# Patient Record
Sex: Male | Born: 1955 | Race: White | Hispanic: No | Marital: Married | Smoking: Never smoker
Health system: Southern US, Community
[De-identification: ages and names within clinical notes are randomized; demographics above are authoritative.]

## PROBLEM LIST (undated history)

## (undated) DIAGNOSIS — L409 Psoriasis, unspecified: Secondary | ICD-10-CM

## (undated) HISTORY — PX: KNEE ARTHROSCOPY: SUR90

## (undated) HISTORY — DX: Psoriasis, unspecified: L40.9

---

## 2006-04-25 ENCOUNTER — Inpatient Hospital Stay (HOSPITAL_COMMUNITY): Admission: EM | Admit: 2006-04-25 | Discharge: 2006-04-27 | Payer: Self-pay | Admitting: Emergency Medicine

## 2008-01-01 ENCOUNTER — Encounter: Admission: RE | Admit: 2008-01-01 | Discharge: 2008-01-01 | Payer: Self-pay | Admitting: Orthopedic Surgery

## 2011-03-20 NOTE — Discharge Summary (Signed)
NAME:  Wayne Munoz, Wayne Munoz NO.:  [id number]   MEDICAL RECORD NO.:  [id number]          PATIENT TYPE:  INP   LOCATION:  5707                         FACILITY:  [facility]   PHYSICIAN:  Sherin Quarry, MD      DATE OF BIRTH:  1955-11-05   DATE OF ADMISSION:  04/25/2006  DATE OF DISCHARGE:  04/27/2006                                 DISCHARGE SUMMARY   Wayne Munoz is a 55 year old man who initially presented to [facility] on June 24 with a several day history of mid epigastric pain. Prior  to his presentation, he had a CT scan of the abdomen done as an outpatient  which was apparently negative. On the night prior to presentation, he ate  popcorn and then the pain became worse.  He presented to [facility]  where he was seen by Dr. Arthor Captain.  On physical exam, Dr. Arthor Captain noted that  the chest was clear.  Cardiovascular revealed normal S1and S2 without rubs,  murmurs or gallops.  The abdomen was remarkable for epigastric discomfort.   Laboratory studies obtained included CBC which showed a white count of 7800  and hemoglobin 14.7.  Sodium is 136, potassium 3.9, glucose was 90,  creatinine 1.2, BUN 9.  Liver profile was normal.  The amylase was 463,  lipase was 395.   The patient denied any history of alcohol abuse and, therefore, attention  was directed to his gallbladder.  An ultrasound of the gallbladder was done  on June 25 which showed no signs of gallstones or cholecystitis and a  hepatobiliary scan was done on the same date which was completely normal.  Lipid profile showed that the triglyceride level was 64.  Thus, the  explanation for the patient's episode of pancreatitis remained unclear. On  admission, Dr. Arthor Captain placed him on an IV of normal saline at 150 mL/hour  and made him n.p.o. Protonix 40 mg IV every 12 hours was begun as well as  morphine 2-4 mg every 2-4 hours p.r.n. for pain. The next day, the patient  was quite a bit better. We had  another conversation in which he once again  indicated that he had not consumed any alcohol. He was allowed to have a  clear liquid diet and then he was advanced to a bland low fat diet on June  26.  The patient was given instructions about a low fat diet.  By June 26,  the amylase was down to 167, lipase was 111. The patient was feeling good.  It was felt reasonable to go ahead and discharge him. Therefore, on June 26,  the patient was discharged.   DISCHARGE DIAGNOSIS:  Acute pancreatitis of uncertain etiology.   He was instructed at the time of discharge to follow up with [facility].   DISCHARGE MEDICATIONS:  Nexium 40 mg daily and Tylox, #20, 1-2 every 6 hours  p.r.n. for pain.   He was advised to follow a low fat diet for at least seven days and was told  that he could return to work on July 5.   CONDITION  ON DISCHARGE:  Good.   Please note that if the patient should have recurrent episodes of  pancreatitis, I would think that a GI consultation would be indicated for  possible ERCP, presuming that once again the etiology of pancreatitis is not  thought to be related to alcohol use.           ______________________________  Sherin Quarry, MD     SY/MEDQ  D:  05/11/2006  T:  05/11/2006  Job:  [id number]   cc:   [facility]

## 2011-03-20 NOTE — Discharge Summary (Signed)
NAME:  Wayne Munoz NO.:  [id number]   MEDICAL RECORD NO.:  [id number]          PATIENT TYPE:  INP   LOCATION:  5707                         FACILITY:  [facility]   PHYSICIAN:  Sherin Quarry, MD      DATE OF BIRTH:  Mar 15, 1956   DATE OF ADMISSION:  04/25/2006  DATE OF DISCHARGE:                                 DISCHARGE SUMMARY   HISTORY OF PRESENT ILLNESS:  Wayne Munoz is a 55 year old man who has  always been in good health.  His main complaint in the past has been  psoriasis.  On April 25, 2006, Wayne Munoz presented to [facility] emergency room with a several-day history of epigastric pain.  The  patient saw Dr. Morrie Sheldon and was diagnosed with pancreatitis.  A CT scan of the  abdomen was done which was negative.  He was started on Nexium and Carafate  and seemed to do better until the night prior to admission when he ate a  large amount of popcorn.  After doing this, he experienced more severe pain  described as midepigastric radiating through to the back.  He presented to  the emergency room with these complaints.   PHYSICAL EXAMINATION:  VITAL SIGNS:  In the emergency room, his blood  pressure was 101/61, heart rate 71, respirations 16.  HEENT:  Within normal limits.  CHEST:  Clear.  CARDIOVASCULAR:  Normal S1 and S2 without rubs, murmurs or gallops.  ABDOMEN:  The abdomen was soft.  It was not distended.  There was diffuse  abdominal tenderness, particularly in the epigastric area.  There was no  guarding or rebound.  NEUROLOGIC:  Neurologic testing was within normal limits.  EXTREMITIES:  No evidence of cyanosis or edema.   LABORATORY DATA:  White count of 7800, hemoglobin 14.7, potassium of 3.9,  creatinine 1.2.  Liver functions were normal.  Lipase was 395.  Amylase was  463.   Wayne Munoz was asked by several people about whether he had any history  of alcohol consumption.  He indicated that he did not drink any alcohol at  all.   On admission, he was started on an IV of normal saline at 150 cc per hour.  Protonix 40 mg IV every 12 hours was ordered, and the patient was given  morphine 2 mg every 2-4 hours p.r.n. for pain.  Because the patient had just  had a CT scan of the abdomen, this was not repeated.  Since there is no  history of alcohol consumption, we next turned our attention to possible  underlying gallbladder disease.  An abdominal ultrasound was obtained which  showed no evidence of cholecystitis or gallstones.  This was followed up  with a hepatobiliary scan that was absolutely normal, showing normal  gallbladder ejection.  A lipid profile was done to rule out  hypertriglyceridemia.  This also looked excellent.  The total cholesterol  was 123, LDL was 77, HDL was 33, triglycerides were 64.  With conservative  treatment, the patient's lipase and amylase came down to an amylase of 167,  lipase of 111, with are a very  normal range.  The patient remained afebrile.  His epigastric pain resolved.  His diet was slowly advanced, such that by  April 27, 2006 he was receiving a bland low-fat diet.  As he was having very  minimal pain, it was felt reasonable to discharge him at that time.   DISCHARGE DIAGNOSIS:  Pancreatitis of uncertain etiology with no evidence of  alcohol abuse or gallbladder disease   DISCHARGE MEDICATIONS:  1.  Nexium 40 mg daily.  2.  Tylox 1-2 every 6 hours p.r.n. for pain.   DIET:  The patient was instructed to follow a low-fat diet.   FOLLOWUP:  He was to return to Dr. Nelly Laurence office in about 7 days.  If [name] turns out to have recurrent episodes of pancreatitis, I would  suggest that he should be referred to a gastroenterologist and consideration  might be given to doing an ERCP and upper endoscopy.  The ERCP, of course,  should not be done during an acute episode of pancreatitis.  Wayne Munoz  also understands that he can return to the emergency room if he develops   worsening abdominal pain.           ______________________________  Sherin Quarry, MD     SY/MEDQ  D:  04/27/2006  T:  04/27/2006  Job:  [id number]   cc:   Alfredia Client, M.D.  [facility]  (also copy of H&P)

## 2011-03-20 NOTE — H&P (Signed)
NAME:  REEDY, BIERNAT NO.:  [id number]   MEDICAL RECORD NO.:  [id number]          PATIENT TYPE:  EMS   LOCATION:  MAJO                         FACILITY:  [facility]   PHYSICIAN:  Michelene Gardener, MD    DATE OF BIRTH:  Feb 21, 1956   DATE OF ADMISSION:  04/25/2006  DATE OF DISCHARGE:                                HISTORY & PHYSICAL   PRIMARY CARE PHYSICIAN:  Alfredia Client, M.D. at [facility].   IDENTIFYING INFORMATION/JUSTIFICATION FOR ADMISSION AND CARE:  This patient  will be admitted as an unassigned patient.   CHIEF COMPLAINT:  Increasing abdominal pain.   HISTORY OF PRESENT ILLNESS:  This is a 55 year old Caucasian male with no  significant past medical history who presented with the above mentioned  complaints. He stated that he has been having this abdominal pain for the  last few days. He went and saw his family physician a few days ago and was  diagnosed with pancreatitis. A CT scan of the abdomen was done as an  outpatient by his primary care physician and as per him, it was negative and  that was done 2 days ago. Last night, he ate popcorn and after that, his  abdominal pain became worse and his pain increased to about 6 to 7 out of  10. The abdominal pain was described as a sharp pain, mainly in the  epigastric region and sometimes radiating to his other part of the abdomen  and to his back, increased when he goes to the bathroom and was eating and  no other relieving factors. Currently, his pain is around a 2 out of 10  because he received pain medication. In the emergency room, his lipase and  amylase were elevated.   PAST MEDICAL HISTORY:  Denied.   PAST SURGICAL HISTORY:  He had right knee procedure following trauma in  football game and that was more than 15 years ago.   MEDICATIONS:  1. Nexium 40 mg once daily.  2. Sucralfate 4 times daily.  The 2 medications were started just 3 days ago by his primary care  physician.   ALLERGIES:  NO KNOWN DRUG ALLERGIES.   SOCIAL HISTORY:  He denies smoking, denied alcohol drinking, and denied  recreational drugs.   FAMILY HISTORY:  No history of hypertension, no diabetes, no  hypercholesterolemia, no coronary artery disease, no history of  pancreatitis.   REVIEW OF SYSTEMS:  Positive for abdominal pain and nausea. Other systems:  CONSTITUTIONAL:  There is no fever, no fatigability, and no weight changes.  HEENT:  Eyes, no blurred vision, no pain, no weakness.  ENT, no difficulty  swallowing. No epistaxis and no difficulty hearing. CARDIOVASCULAR:  No  chest pain, no palpitations, no syncope. RESPIRATORY:  No cough, no wheeze,  no hemoptysis. GASTROINTESTINAL:  Positive for nausea and abdominal pain.  There is no hematemesis, no melena. GENITOURINARY:  No dysuria, no  hematuria, and no frequency. SKIN:  No rash, no lesions. HEMATOLOGY:  No  bruises and no bleeding. NEUROLOGIC:  No numbness and no weakness. Rest of  systems were reviewed and they were negative.  PHYSICAL EXAMINATION:  VITAL SIGNS:  Temperature is 99.1, heart rate is 71,  respiratory rate 16, blood pressure 101/61.  GENERAL:  This is a middle aged Caucasian male who is lying in bed. No acute  distress at the present time.  HEENT:  Conjunctivae is normal. There is no palor and no erythema. Pupils  are equal, round, and reactive to light and accommodation. There is no  ptosis. Hearing is intact. There is no ear discharges or infection. There is  no nasal discharge, infection, or bleeding. Oral mucosa is moist and there  is no pharyngeal erythema.  NECK:  Supple. No JVD. No carotid bruit, no lymphadenopathy, no thyromegaly  or tenderness.  CARDIOVASCULAR:  S1 and S2 were heared and regular . There are no additional  heart sounds. There is no murmurs. No gallops and no thrills.  RESPIRATORY:  Respiratory rate 16 to 18.  There is no use of accessory  muscles, no dullness.  No rales, no rhonchi, no  wheezes.  ABDOMEN:  Abdomen is soft. Not distended. There is diffuse abdominal  tenderness, especially in the epigastric region to deep palpation. There is  no hepatosplenomegaly. Bowel sounds are normal.  EXTREMITIES:  Lower extremities, no edema, no rash, and no varicose veins.  SKIN:  There is some psoriatic lesions in his upper extremities and he says  he has psoriasis, for which he takes some medications but he does not  remember the names.  NEUROLOGIC:  There is no motor or sensory deficits.   LABORATORY DATA:  WBC 7.8, hemoglobin 14.7, hematocrit 42.6. MCV is 88.  Platelet count is 225,000. Sodium 136, potassium 3.9, chloride 103,  bicarbonate 25, glucose 90, BUN 9, creatinine 1.2. Calcium is 9.3. AST 18,  ALT 17, alkaline phosphatase is 83. Total bilirubin is 1.6. Lipase 395 and  amylase is 463.   IMPRESSION/ASSESSMENT:  ACUTE PANCREATITIS:  As mentioned, this patient was  diagnosed with acute pancreatitis. He denied any alcohol drinking. He had CT  scan of the abdomen done as an outpatient and it was negative as per him. I  will admit him to the regular medical floor. I will keep him NPO. I will  also start him on IV fluids normal saline at 150 cc per hour. I will give  him pain medications p.r.n. Will also start him on anti-emetics as needed.  We will do an ultrasound of his gallbladder to rule out  any underlying  gallstones. I will also consider getting a GI consultation on him because of  his elevated bilirubin with possibility of ERCP.   DURATION OF ASSESSMENT:  50 minutes.      Michelene Gardener, MD  Electronically Signed     NAE/MEDQ  D:  04/25/2006  T:  04/26/2006  Job:  [id number]   cc:   Alfredia Client, M.D.

## 2014-11-09 ENCOUNTER — Other Ambulatory Visit: Payer: 59

## 2014-11-09 ENCOUNTER — Encounter (INDEPENDENT_AMBULATORY_CARE_PROVIDER_SITE_OTHER): Payer: Self-pay

## 2014-11-09 ENCOUNTER — Other Ambulatory Visit: Payer: Self-pay | Admitting: Family Medicine

## 2014-11-09 ENCOUNTER — Ambulatory Visit (INDEPENDENT_AMBULATORY_CARE_PROVIDER_SITE_OTHER): Payer: 59

## 2014-11-09 DIAGNOSIS — Z79899 Other long term (current) drug therapy: Secondary | ICD-10-CM

## 2014-12-04 ENCOUNTER — Telehealth: Payer: Self-pay | Admitting: Family Medicine

## 2014-12-04 ENCOUNTER — Ambulatory Visit: Payer: 59 | Admitting: Family Medicine

## 2014-12-04 ENCOUNTER — Encounter: Payer: Self-pay | Admitting: Family Medicine

## 2014-12-04 ENCOUNTER — Ambulatory Visit (INDEPENDENT_AMBULATORY_CARE_PROVIDER_SITE_OTHER): Payer: 59 | Admitting: Family Medicine

## 2014-12-04 VITALS — BP 133/87 | HR 68 | Temp 97.3°F | Wt 248.0 lb

## 2014-12-04 DIAGNOSIS — R200 Anesthesia of skin: Secondary | ICD-10-CM

## 2014-12-04 DIAGNOSIS — G540 Brachial plexus disorders: Secondary | ICD-10-CM

## 2014-12-04 MED ORDER — FOLIC ACID 1 MG PO TABS: 1.0000 mg | ORAL_TABLET | Freq: Every day | ORAL | Status: DC

## 2014-12-04 MED ORDER — NAPROXEN 500 MG PO TABS: 500.0000 mg | ORAL_TABLET | Freq: Two times a day (BID) | ORAL | Status: DC

## 2014-12-04 NOTE — Telephone Encounter (Signed)
I spoke with wife and she said he has had tingling in his arm weekly after starting the methotrexate. Per wife patient is not having any chest pain or sob. Patient wife advised that our 1st available appointment is 5:45 and that if he starts having and chest pain or SOB to call 911.

## 2014-12-05 NOTE — Progress Notes (Signed)
   Subjective:    Patient ID: Wayne Munoz, male    DOB: 02/22/1956, 59 y.o.   MRN: [id number]  HPI Patient c/o left arm and hand numbness and tingling.  He has noticed this since starting MTX by dermatologist. He is not taking folic acid.  He has been doing a lot over the head lifting of boxes.  Review of Systems  Constitutional: Negative for fever.  HENT: Negative for ear pain.   Eyes: Negative for discharge.  Respiratory: Negative for cough.   Cardiovascular: Negative for chest pain.  Gastrointestinal: Negative for abdominal distention.  Endocrine: Negative for polyuria.  Genitourinary: Negative for difficulty urinating.  Musculoskeletal: Negative for gait problem and neck pain.  Skin: Negative for color change and rash.  Neurological: Negative for speech difficulty and headaches.  Psychiatric/Behavioral: Negative for agitation.       Objective:    BP 133/87 mmHg  Pulse 68  Temp(Src) 97.3 F (36.3 C) (Oral)  Wt 248 lb (112.492 kg) Physical Exam  Constitutional: He is oriented to person, place, and time. He appears well-developed and well-nourished.  HENT:  Head: Normocephalic and atraumatic.  Mouth/Throat: Oropharynx is clear and moist.  Eyes: Pupils are equal, round, and reactive to light.  Neck: Normal range of motion. Neck supple.  Cardiovascular: Normal rate and regular rhythm.   No murmur heard. Pulmonary/Chest: Effort normal and breath sounds normal.  Abdominal: Soft. Bowel sounds are normal. There is no tenderness.  Musculoskeletal:  TTP left shoulder. When patient lifts left hand above his head his left hand has pallor and his left radial pulse is diminished and c/o numbness in left hand.  Neurological: He is alert and oriented to person, place, and time.  Skin: Skin is warm and dry.  Psychiatric: He has a normal mood and affect.          Assessment & Plan:     ICD-9-CM ICD-10-CM   1. Numbness 782.0 R20.0 folic acid (FOLVITE) 1 MG tablet  2. TOS  (thoracic outlet syndrome) 353.0 G54.0 naproxen (NAPROSYN) 500 MG tablet     No Follow-up on file.  Deatra CanterWilliam J  FNP

## 2014-12-12 ENCOUNTER — Other Ambulatory Visit: Payer: Self-pay | Admitting: Family Medicine

## 2014-12-12 ENCOUNTER — Telehealth: Payer: Self-pay | Admitting: Family Medicine

## 2014-12-12 DIAGNOSIS — Z0000 Encounter for general adult medical examination without abnormal findings: Secondary | ICD-10-CM

## 2014-12-12 NOTE — Telephone Encounter (Signed)
Please ask who he will be seeing for CPE if they can order labs because I will not be here after Friday and may not get the results.  The labs should be ordered under the provider who will be doing the CPE.

## 2014-12-12 NOTE — Telephone Encounter (Signed)
Stp's wife and advised Annette StableBill is leaving, pt scheduled with Dr.Stacks 2/23 at 8:25 and pt can have any labs drawn that day.

## 2014-12-14 ENCOUNTER — Encounter: Payer: 59 | Admitting: Family Medicine

## 2014-12-25 ENCOUNTER — Encounter: Payer: Self-pay | Admitting: Family Medicine

## 2014-12-25 ENCOUNTER — Ambulatory Visit (INDEPENDENT_AMBULATORY_CARE_PROVIDER_SITE_OTHER): Payer: 59 | Admitting: Family Medicine

## 2014-12-25 VITALS — BP 130/73 | HR 64 | Temp 97.3°F | Ht 70.0 in | Wt 247.0 lb

## 2014-12-25 DIAGNOSIS — M79602 Pain in left arm: Secondary | ICD-10-CM | POA: Diagnosis not present

## 2014-12-25 DIAGNOSIS — L401 Generalized pustular psoriasis: Secondary | ICD-10-CM

## 2014-12-25 DIAGNOSIS — L409 Psoriasis, unspecified: Secondary | ICD-10-CM

## 2014-12-25 DIAGNOSIS — Z1212 Encounter for screening for malignant neoplasm of rectum: Secondary | ICD-10-CM

## 2014-12-25 DIAGNOSIS — Z0000 Encounter for general adult medical examination without abnormal findings: Secondary | ICD-10-CM

## 2014-12-25 LAB — POCT CBC
Granulocyte percent: 76 %G (ref 37–80)
HCT, POC: 48.1 % (ref 43.5–53.7)
HEMOGLOBIN: 15.1 g/dL (ref 14.1–18.1)
LYMPH, POC: 1 (ref 0.6–3.4)
MCH: 27.7 pg (ref 27–31.2)
MCHC: 31.4 g/dL — AB (ref 31.8–35.4)
MCV: 88.2 fL (ref 80–97)
MPV: 6.4 fL (ref 0–99.8)
PLATELET COUNT, POC: 205 10*3/uL (ref 142–424)
POC Granulocyte: 4.1 (ref 2–6.9)
POC LYMPH %: 18.4 % (ref 10–50)
RBC: 5.45 M/uL (ref 4.69–6.13)
RDW, POC: 14 %
WBC: 5.4 10*3/uL (ref 4.6–10.2)

## 2014-12-25 MED ORDER — DULOXETINE HCL 30 MG PO CPEP: 30.0000 mg | ORAL_CAPSULE | Freq: Every day | ORAL | Status: DC

## 2014-12-25 NOTE — Progress Notes (Signed)
Subjective:  Patient ID: Wayne Munoz, male    DOB: 03/25/1956  Age: 59 y.o. MRN: [id number]  CC: Annual Exam   HPI Wayne Munoz presents for onset of a mild sensation of numbness and tingling in the left arm. The symptoms actually go from the left axillary shoulder region to the left thumb and are described as very mild but constant. Onset about 5-6 weeks ago. Onset was precipitated by or at least temporally related to his second dose of methotrexate. He was given this for his psoriasis. He states that his main concern is actually the psoriasis. However he wants to make sure that this sensation is nothing serious. He denies any chest pain shortness of breath diaphoresis nausea. For other symptoms look at review of systems.  History Wayne Munoz has no past medical history on file.   He has no past surgical history on file.   His family history is not on file.He reports that he has never smoked. He does not have any smokeless tobacco history on file. His alcohol and drug histories are not on file.  Current Outpatient Prescriptions on File Prior to Visit  Medication Sig Dispense Refill  . naproxen (NAPROSYN) 500 MG tablet Take 1 tablet (500 mg total) by mouth 2 (two) times daily with a meal. 30 tablet 0   No current facility-administered medications on file prior to visit.    ROS Review of Systems  Constitutional: Negative for fever, chills, diaphoresis, activity change, appetite change, fatigue and unexpected weight change.  HENT: Negative for congestion, ear pain, hearing loss, postnasal drip, rhinorrhea, sore throat, tinnitus and trouble swallowing.   Eyes: Negative for photophobia, pain, discharge and redness.  Respiratory: Negative for apnea, cough, choking, chest tightness, shortness of breath, wheezing and stridor.   Cardiovascular: Negative for chest pain, palpitations and leg swelling.  Gastrointestinal: Negative for nausea, vomiting, abdominal pain, diarrhea, constipation,  blood in stool and abdominal distention.  Endocrine: Negative for cold intolerance, heat intolerance, polydipsia, polyphagia and polyuria.  Genitourinary: Negative for dysuria, urgency, frequency, hematuria, flank pain, enuresis, difficulty urinating and genital sores.  Musculoskeletal: Negative for joint swelling and arthralgias.  Skin: Negative for color change, rash and wound.  Allergic/Immunologic: Negative for immunocompromised state.  Neurological: Positive for numbness. Negative for dizziness, tremors, seizures, syncope, facial asymmetry, speech difficulty, weakness, light-headedness and headaches (and burning in left arm).  Hematological: Does not bruise/bleed easily.  Psychiatric/Behavioral: Negative for suicidal ideas, hallucinations, behavioral problems, confusion, sleep disturbance, dysphoric mood, decreased concentration and agitation. The patient is not nervous/anxious and is not hyperactive.     Objective:  BP 130/73 mmHg  Pulse 64  Temp(Src) 97.3 F (36.3 C) (Oral)  Ht 5' 10"  (1.778 m)  Wt 247 lb (112.038 kg)  BMI 35.44 kg/m2  BP Readings from Last 3 Encounters:  12/25/14 130/73  12/04/14 133/87    Wt Readings from Last 3 Encounters:  12/25/14 247 lb (112.038 kg)  12/04/14 248 lb (112.492 kg)     Physical Exam  Constitutional: He is oriented to person, place, and time. He appears well-developed and well-nourished.  HENT:  Head: Normocephalic and atraumatic.  Mouth/Throat: Oropharynx is clear and moist.  Eyes: EOM are normal. Pupils are equal, round, and reactive to light.  Neck: Normal range of motion. No tracheal deviation present. No thyromegaly present.  Cardiovascular: Normal rate, regular rhythm and normal heart sounds.  Exam reveals no gallop and no friction rub.   No murmur heard. Pulmonary/Chest: Breath sounds normal. He has no wheezes.  He has no rales.  Abdominal: Soft. He exhibits no mass. There is no tenderness.  Genitourinary: Rectum normal and  prostate normal.  Musculoskeletal: Normal range of motion. He exhibits no edema.  Neurological: He is alert and oriented to person, place, and time.  Skin: Skin is warm and dry.  Psychiatric: He has a normal mood and affect.    No results found for: HGBA1C  No results found for: WBC, HGB, HCT, PLT, GLUCOSE, CHOL, TRIG, HDL, LDLDIRECT, LDLCALC, ALT, AST, NA, K, CL, CREATININE, BUN, CO2, TSH, PSA, INR, GLUF, HGBA1C, MICROALBUR  Mr Extrem Low Jt*l* W/o Cm  01/02/2008   Clinical Data:  Medial and lateral knee pain with a knot behind the knee.   MRI LEFT KNEE WITHOUT CONTRAST: Technique:  Multiplanar, multisequence MR imaging of the knee was performed following the standard protocol.  No intravenous contrast was administered. Findings:  The menisci, anterior and posterior cruciate ligaments, medial and lateral collateral ligament complexes and extensor mechanism of the knee are all intact.   The patient has degenerative change about the knee asymmetrically worst in the patellofemoral compartment where fairly extensive subchondral cyst formation and edema are seen in the patellar apex and along the lateral patellar facet.  Milder degree of medial and lateral compartment degenerative disease is noted.  Small T2- hyperintense collection is seen along the posterior cruciate ligament extending from the ligaments attachment to the medial aspect of the intercondylar notch inferiorly.  This is compatible with a PCL cyst measuring 1.9 cm craniocaudal x 1.2 cm AP x 1.0 cm transverse.   There is synovial hypertrophy identified and a small joint effusion consistent with synovitis.  The patient has a Engineer, agricultural cyst which measures 7.5 cm craniocaudal x 2.9 cm AP x 3.9 cm transverse.   IMPRESSION: 1.  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  There is associated synovitis.  2.  Large Baker's cyst. 3.  Small PCL ganglion cyst. 4.  Negative for meniscal  tear.  Provider: Lavonia Dana   Assessment & Plan:   Wayne Munoz was seen today for annual exam.  Diagnoses and all orders for this visit:  Left arm pain Orders: -     EKG 12-Lead  Wellness examination Orders: -     POCT CBC -     CMP14+EGFR -     NMR, lipoprofile -     PSA, total and free -     Thyroid Panel With TSH -     Vit D  25 hydroxy (rtn osteoporosis monitoring) -     EKG 12-Lead   I have discontinued Wayne Munoz methotrexate and folic acid. I am also having him maintain his naproxen and calcipotriene-betamethasone.  Meds ordered this encounter  Medications  . calcipotriene-betamethasone (TACLONEX) ointment    Sig:     Comments: stress echo ordered  Follow-up: No Follow-up on file.  Claretta Fraise, M.D.

## 2014-12-25 NOTE — Addendum Note (Signed)
Addended by: Prescott GumLAND,  M on: 12/25/2014 11:35 AM   Modules accepted: Orders, SmartSet

## 2014-12-26 LAB — PSA, TOTAL AND FREE
PSA FREE PCT: 13 %
PSA FREE: 0.26 ng/mL
PSA: 2 ng/mL (ref 0.0–4.0)

## 2014-12-26 LAB — CMP14+EGFR
ALBUMIN: 4.5 g/dL (ref 3.5–5.5)
ALT: 16 IU/L (ref 0–44)
AST: 18 IU/L (ref 0–40)
Albumin/Globulin Ratio: 1.6 (ref 1.1–2.5)
Alkaline Phosphatase: 94 IU/L (ref 39–117)
BUN/Creatinine Ratio: 11 (ref 9–20)
BUN: 13 mg/dL (ref 6–24)
Bilirubin Total: 1.5 mg/dL — ABNORMAL HIGH (ref 0.0–1.2)
CHLORIDE: 102 mmol/L (ref 97–108)
CO2: 22 mmol/L (ref 18–29)
Calcium: 9.4 mg/dL (ref 8.7–10.2)
Creatinine, Ser: 1.14 mg/dL (ref 0.76–1.27)
GFR calc Af Amer: 83 mL/min/{1.73_m2} (ref >59)
GFR calc non Af Amer: 71 mL/min/{1.73_m2} (ref >59)
GLUCOSE: 99 mg/dL (ref 65–99)
Globulin, Total: 2.8 g/dL (ref 1.5–4.5)
POTASSIUM: 4.2 mmol/L (ref 3.5–5.2)
Sodium: 142 mmol/L (ref 134–144)
TOTAL PROTEIN: 7.3 g/dL (ref 6.0–8.5)

## 2014-12-26 LAB — VITAMIN D 25 HYDROXY (VIT D DEFICIENCY, FRACTURES): VIT D 25 HYDROXY: 20.6 ng/mL — AB (ref 30.0–100.0)

## 2014-12-26 LAB — NMR, LIPOPROFILE
Cholesterol: 164 mg/dL (ref 100–199)
HDL CHOLESTEROL BY NMR: 46 mg/dL (ref >39)
HDL Particle Number: 31 umol/L (ref >=30.5)
LDL Particle Number: 888 nmol/L (ref <1000)
LDL Size: 20.8 nm (ref >20.5)
LDL-C: 98 mg/dL (ref 0–99)
LP-IR Score: 56 — ABNORMAL HIGH (ref <=45)
Small LDL Particle Number: 386 nmol/L (ref <=527)
Triglycerides by NMR: 102 mg/dL (ref 0–149)

## 2014-12-26 LAB — THYROID PANEL WITH TSH
Free Thyroxine Index: 3 (ref 1.2–4.9)
T3 UPTAKE RATIO: 28 % (ref 24–39)
T4, Total: 10.6 ug/dL (ref 4.5–12.0)
TSH: 2.46 u[IU]/mL (ref 0.450–4.500)

## 2014-12-28 ENCOUNTER — Other Ambulatory Visit: Payer: Self-pay | Admitting: Family Medicine

## 2014-12-28 LAB — FECAL OCCULT BLOOD, IMMUNOCHEMICAL: Fecal Occult Bld: NEGATIVE

## 2014-12-28 MED ORDER — VITAMIN D (ERGOCALCIFEROL) 1.25 MG (50000 UNIT) PO CAPS: 50000.0000 [IU] | ORAL_CAPSULE | ORAL | Status: DC

## 2015-01-19 ENCOUNTER — Other Ambulatory Visit: Payer: Self-pay | Admitting: *Deleted

## 2015-01-19 MED ORDER — DULOXETINE HCL 30 MG PO CPEP: 30.0000 mg | ORAL_CAPSULE | Freq: Every day | ORAL | Status: DC

## 2015-01-23 ENCOUNTER — Ambulatory Visit: Payer: 59 | Admitting: Family Medicine

## 2015-05-11 ENCOUNTER — Encounter: Payer: Self-pay | Admitting: *Deleted

## 2015-06-17 ENCOUNTER — Ambulatory Visit: Payer: 59 | Admitting: Family Medicine

## 2015-08-02 IMAGING — CR DG CHEST 2V
2 series · 2 of 2 positions shown · non-contrast
Comparison: None.

CLINICAL DATA: Psoriasis.  Methotrexate treatment.

EXAM:
CHEST  2 VIEW

[view not recorded (1 of 2)]
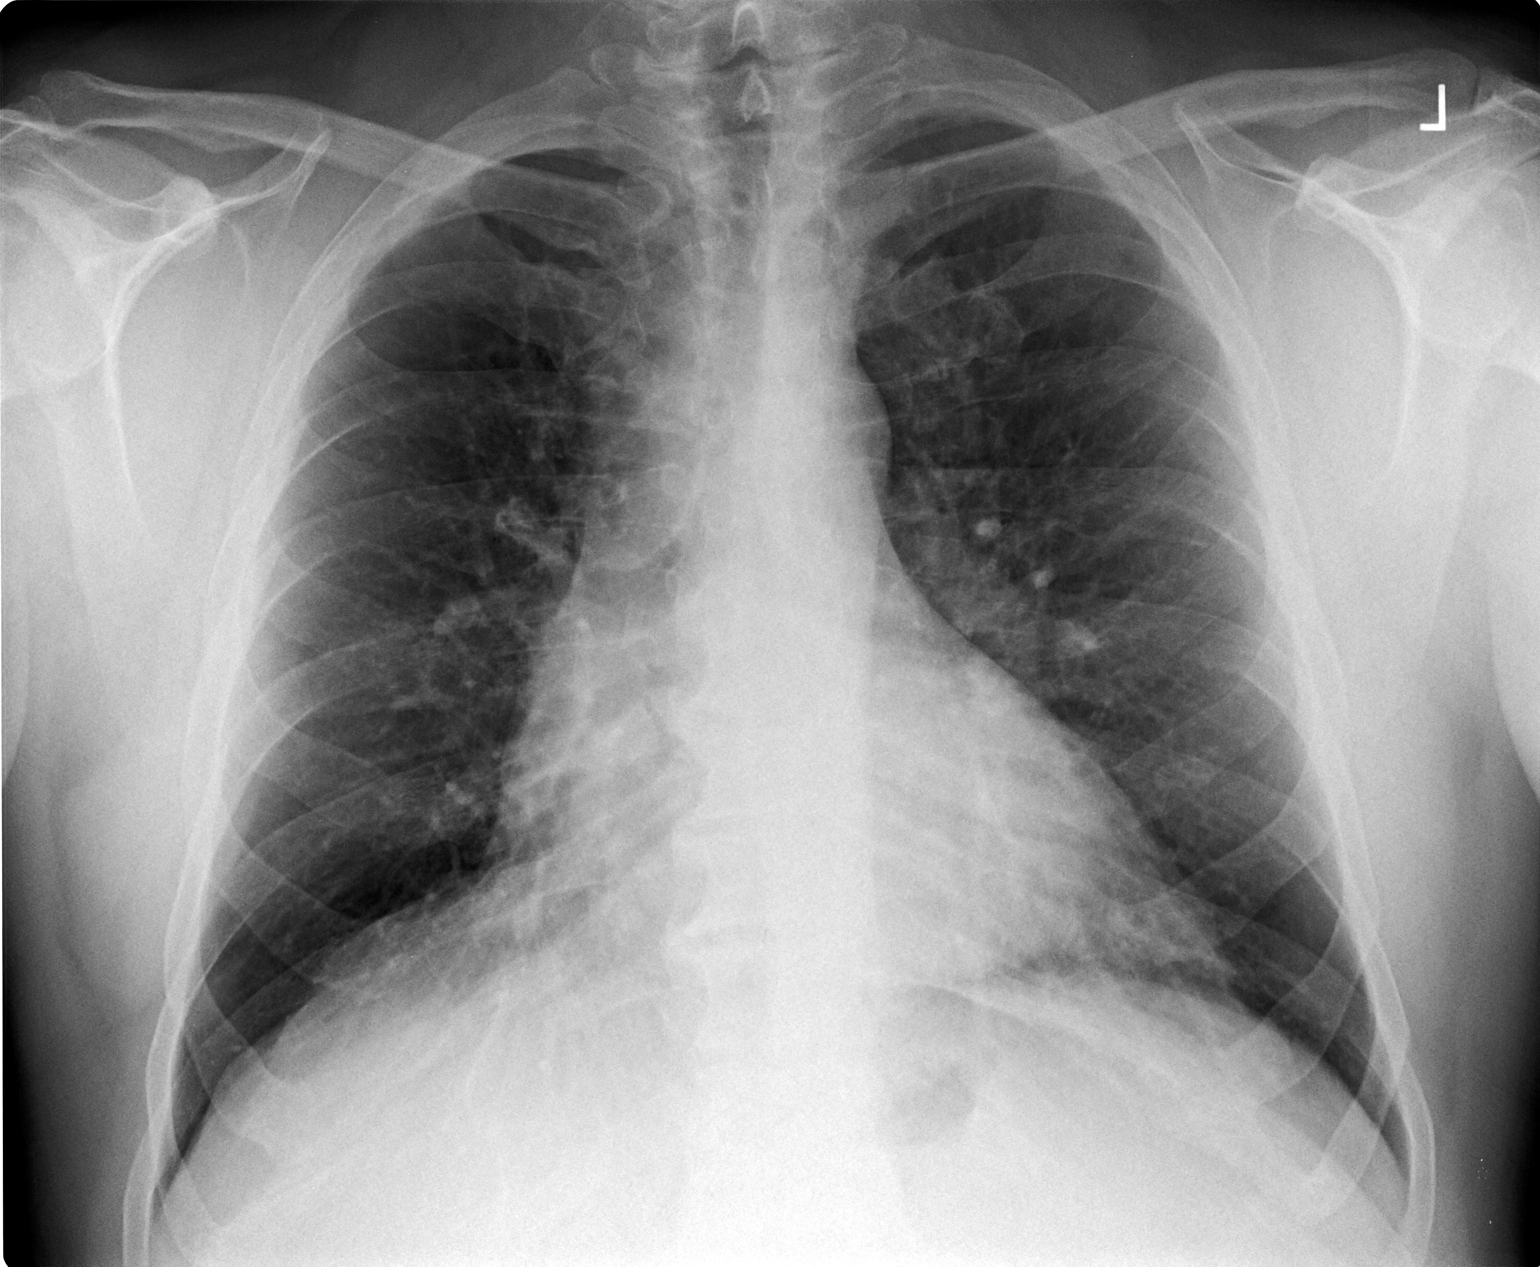

[view not recorded (2 of 2)]
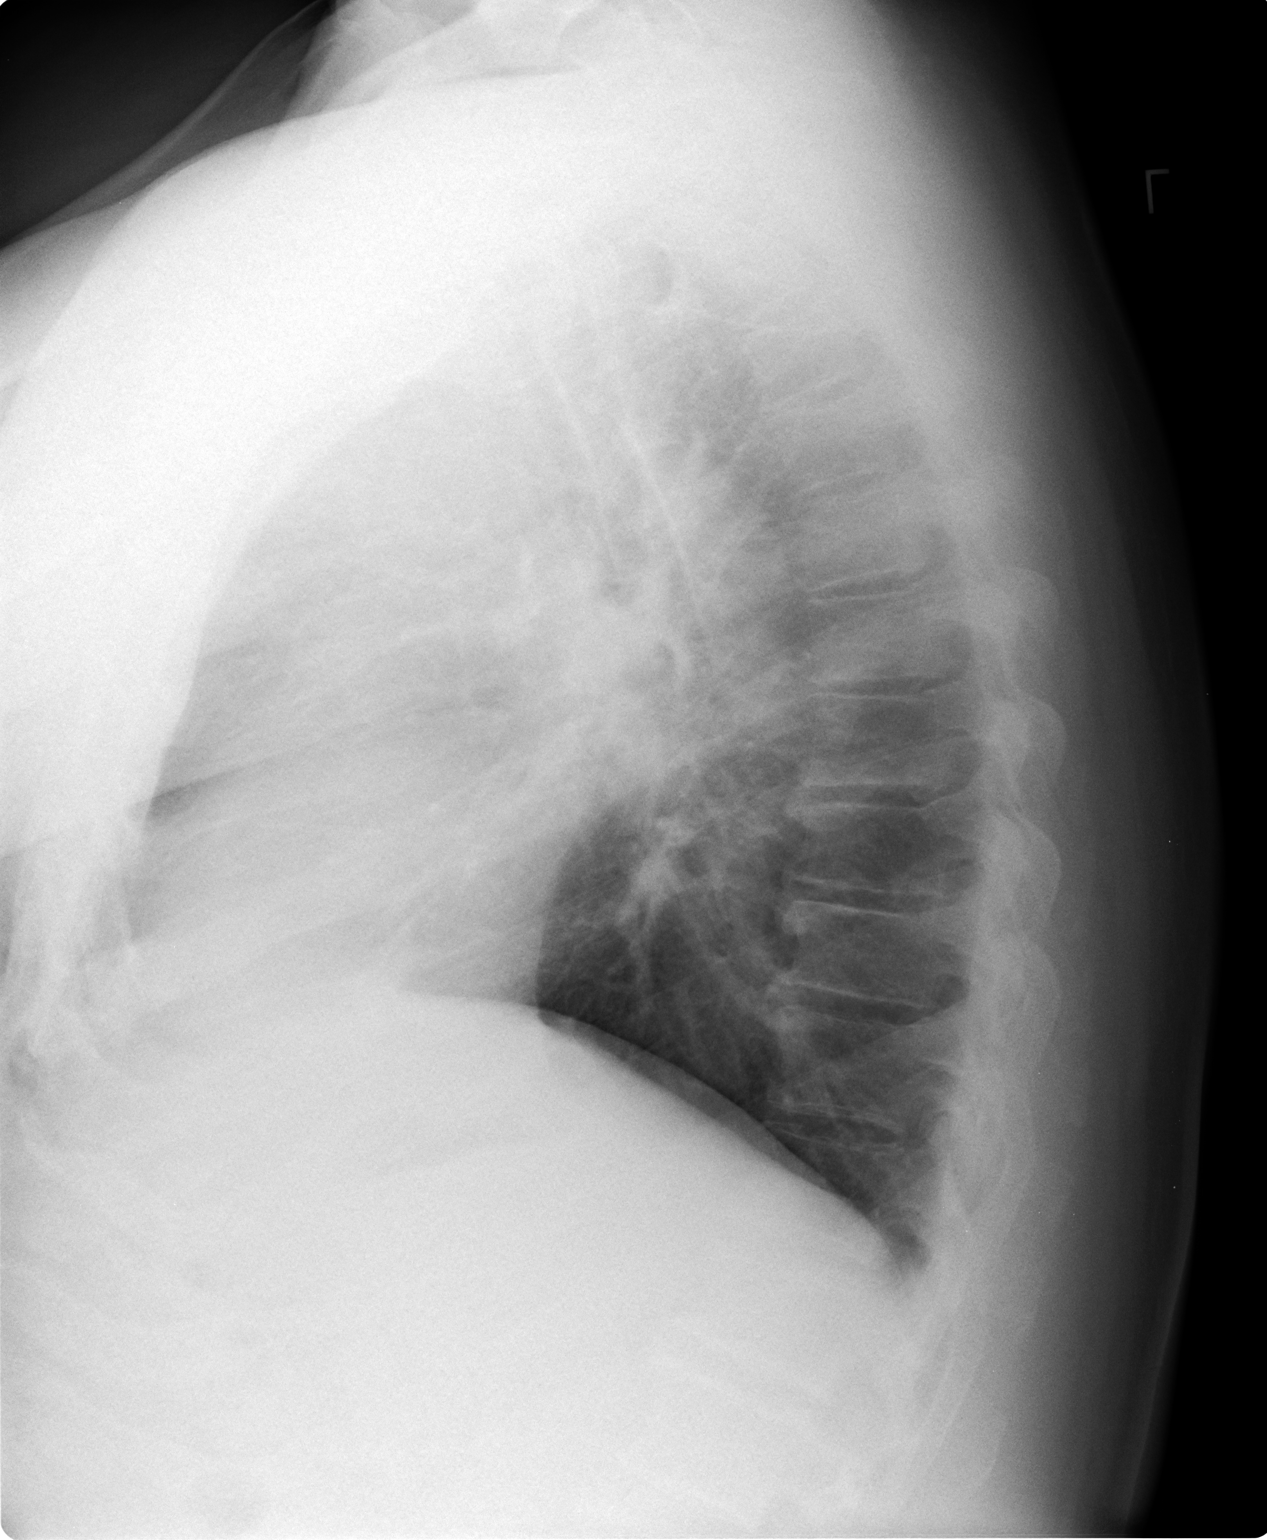

[2 of 2 positions shown; findings below may reference images not displayed]

FINDINGS: Mediastinum and hilar structures normal. Lungs are clear. No pleural
effusion. No pneumothorax. Mild cardiomegaly. Normal pulmonary
vascularity. No acute bony abnormality .
IMPRESSION: 1. Mild cardiomegaly, no CHF.
2. No acute pulmonary infiltrate.

## 2016-03-03 ENCOUNTER — Other Ambulatory Visit: Payer: Self-pay | Admitting: Family Medicine

## 2016-05-13 ENCOUNTER — Telehealth: Payer: Self-pay | Admitting: Family Medicine

## 2016-05-13 NOTE — Telephone Encounter (Signed)
Patient's wife aware of recommendations

## 2016-05-13 NOTE — Telephone Encounter (Signed)
Pt was headed out of town, c/o back pain & not able to urinate. [facility] [place] [facility] [place] Urine [place], they suggested he maybe passing a kidney stone. He received a shot which helped with the pain & he has not had any significant pain since, just has some tenderness in the right lower abd area.

## 2016-05-13 NOTE — Telephone Encounter (Signed)
If symptoms return he should be seen. For now, I do not recommend a scan.

## 2017-02-09 ENCOUNTER — Ambulatory Visit (INDEPENDENT_AMBULATORY_CARE_PROVIDER_SITE_OTHER): Payer: 59 | Admitting: Family Medicine

## 2017-02-09 ENCOUNTER — Encounter: Payer: Self-pay | Admitting: Family Medicine

## 2017-02-09 VITALS — BP 138/85 | HR 69 | Temp 97.0°F | Ht 70.0 in | Wt 249.0 lb

## 2017-02-09 DIAGNOSIS — N410 Acute prostatitis: Secondary | ICD-10-CM | POA: Diagnosis not present

## 2017-02-09 DIAGNOSIS — R109 Unspecified abdominal pain: Secondary | ICD-10-CM

## 2017-02-09 LAB — URINALYSIS, COMPLETE
Bilirubin, UA: NEGATIVE
GLUCOSE, UA: NEGATIVE
Ketones, UA: NEGATIVE
Leukocytes, UA: NEGATIVE
NITRITE UA: NEGATIVE
PH UA: 5.5 (ref 5.0–7.5)
Protein, UA: NEGATIVE
Specific Gravity, UA: 1.015 (ref 1.005–1.030)
UUROB: 0.2 mg/dL (ref 0.2–1.0)

## 2017-02-09 LAB — MICROSCOPIC EXAMINATION
BACTERIA UA: NONE SEEN
Epithelial Cells (non renal): NONE SEEN /hpf (ref 0–10)
Renal Epithel, UA: NONE SEEN /hpf

## 2017-02-09 MED ORDER — CIPROFLOXACIN HCL 500 MG PO TABS: 500.0000 mg | ORAL_TABLET | Freq: Two times a day (BID) | ORAL | 0 refills | Status: DC

## 2017-02-09 NOTE — Progress Notes (Signed)
Subjective:  Patient ID: Wayne Munoz, male    DOB: [birth year]  Age: 61 y.o. MRN: [id number]  CC: Urinary Urgency (pt here today c/o flank pain and lower abdominal discomfort along with urinary urgency)   HPI Wayne Munoz presents for Onset 2 weeks ago of scrotal discomfort. Seemed to radiate around to his right lower back and then around to his left lower back. He has a sense of urinary frequency and he'll urinate and then just few minutes later have to go again. He's had no fever chills sweats or nausea. He did have a kidney stone about 8 months ago that resembles this in some manner. At that time there was a more severe pain but in the same basic areas. History Wayne Munoz has no past medical history on file.   He has no past surgical history on file.   His family history is not on file.He reports that he has never smoked. He has never used smokeless tobacco. His alcohol and drug histories are not on file.  No current outpatient prescriptions on file prior to visit.   No current facility-administered medications on file prior to visit.     ROS Review of Systems  Constitutional: Negative for chills, diaphoresis and fever.  HENT: Negative for rhinorrhea and sore throat.   Respiratory: Negative for cough and shortness of breath.   Cardiovascular: Negative for chest pain.  Gastrointestinal: Negative for abdominal pain.  Musculoskeletal: Negative for arthralgias and myalgias.  Skin: Negative for rash.  Neurological: Negative for weakness and headaches.    Objective:  BP 138/85   Pulse 69   Temp 97 F (36.1 C) (Oral)   Ht  (1.778 m)   Wt 249 lb (112.9 kg)   BMI 35.73 kg/m   Physical Exam  Constitutional: He is oriented to person, place, and time. He appears well-developed and well-nourished.  HENT:  Head: Normocephalic and atraumatic.  Right Ear: External ear normal.  Left Ear: External ear normal.  Mouth/Throat: No oropharyngeal exudate or posterior  oropharyngeal erythema.  Eyes: Pupils are equal, round, and reactive to light.  Neck: Normal range of motion. Neck supple.  Cardiovascular: Normal rate and regular rhythm.   No murmur heard. Pulmonary/Chest: Breath sounds normal. No respiratory distress.  Neurological: He is alert and oriented to person, place, and time.  Vitals reviewed.   Assessment & Plan:   Wayne Munoz was seen today for urinary urgency.  Diagnoses and all orders for this visit:  Flank pain -     Urinalysis, Complete  Prostatitis, acute  Other orders -     ciprofloxacin (CIPRO) 500 MG tablet; Take 1 tablet (500 mg total) by mouth 2 (two) times daily. For prostate. Take all of these.   I have discontinued Mr. Olguin calcipotriene-betamethasone, Vitamin D (Ergocalciferol), and DULoxetine. I am also having him start on ciprofloxacin. Additionally, I am having him maintain his cholecalciferol and ibuprofen.  Meds ordered this encounter  Medications  . cholecalciferol (VITAMIN D) 1000 units tablet    Sig: Take 1,000 Units by mouth daily.  Marland Kitchen ibuprofen (ADVIL,MOTRIN) 200 MG tablet    Sig: Take 200 mg by mouth every 6 (six) hours as needed.  . ciprofloxacin (CIPRO) 500 MG tablet    Sig: Take 1 tablet (500 mg total) by mouth 2 (two) times daily. For prostate. Take all of these.    Dispense:  30 tablet    Refill:  0     Follow-up: Return if symptoms worsen or fail to improve.  Claretta Fraise, M.D.

## 2017-02-15 ENCOUNTER — Ambulatory Visit: Payer: 59 | Admitting: Family

## 2017-03-09 ENCOUNTER — Other Ambulatory Visit: Payer: Self-pay | Admitting: Family Medicine

## 2017-03-09 MED ORDER — CIPROFLOXACIN HCL 500 MG PO TABS: 500.0000 mg | ORAL_TABLET | Freq: Two times a day (BID) | ORAL | 0 refills | Status: DC

## 2017-03-09 NOTE — Telephone Encounter (Signed)
lmtcb

## 2017-03-09 NOTE — Telephone Encounter (Signed)
What is the name of the medication? Ciprofloxacin 500mg   Have you contacted your pharmacy to request a refill? yes  Which pharmacy would you [facility] sent [facility] [place]   Patient notified that their request is being sent to the clinical staff for review and that they should receive a call once it is complete. If they do not receive a call within 24 hours they can check with their pharmacy or our office.

## 2017-05-14 ENCOUNTER — Ambulatory Visit (INDEPENDENT_AMBULATORY_CARE_PROVIDER_SITE_OTHER): Payer: 59

## 2017-05-14 ENCOUNTER — Encounter: Payer: Self-pay | Admitting: Family

## 2017-05-14 ENCOUNTER — Ambulatory Visit (INDEPENDENT_AMBULATORY_CARE_PROVIDER_SITE_OTHER): Payer: 59 | Admitting: Family

## 2017-05-14 ENCOUNTER — Other Ambulatory Visit: Payer: Self-pay | Admitting: Family

## 2017-05-14 VITALS — BP 125/83 | HR 84 | Temp 97.8°F | Ht 70.0 in | Wt 251.0 lb

## 2017-05-14 DIAGNOSIS — L03115 Cellulitis of right lower limb: Secondary | ICD-10-CM

## 2017-05-14 DIAGNOSIS — M25561 Pain in right knee: Secondary | ICD-10-CM | POA: Diagnosis not present

## 2017-05-14 DIAGNOSIS — M25461 Effusion, right knee: Secondary | ICD-10-CM

## 2017-05-14 MED ORDER — AMOXICILLIN-POT CLAVULANATE 875-125 MG PO TABS: 1.0000 | ORAL_TABLET | Freq: Two times a day (BID) | ORAL | 0 refills | Status: DC

## 2017-05-14 MED ORDER — COLCHICINE 0.6 MG PO TABS: ORAL_TABLET | ORAL | 2 refills | Status: DC

## 2017-05-14 NOTE — Progress Notes (Signed)
   Subjective:    Patient ID: Wayne Munoz, male    DOB: 06/11/1956, 61 y.o.   MRN: [id number]  Pt presents to the office today with new knee pain that started two days ago and become worse. Pt denies any injury, but states he has been in the woods and outside over the last two days. Pt states the pain is worse and hurts to walk on it.  Knee Pain   The incident occurred 3 to 5 days ago. There was no injury mechanism. The pain is present in the right knee. The quality of the pain is described as aching. The pain is at a severity of 8/10. The pain is moderate. The pain has been constant since onset. Associated symptoms include a loss of motion. The symptoms are aggravated by movement and weight bearing. He has tried rest and NSAIDs for the symptoms. The treatment provided mild relief.      Review of Systems  Musculoskeletal: Positive for arthralgias.  All other systems reviewed and are negative.      Objective:   Physical Exam  Constitutional: He is oriented to person, place, and time. He appears well-developed and well-nourished. No distress.  HENT:  Head: Normocephalic.  Cardiovascular: Normal rate, regular rhythm, normal heart sounds and intact distal pulses.   No murmur heard. Pulmonary/Chest: Effort normal and breath sounds normal. No respiratory distress. He has no wheezes.  Abdominal: Soft. Bowel sounds are normal. He exhibits no distension. There is no tenderness.  Musculoskeletal: He exhibits edema (2+ in right knee). He exhibits no tenderness.  Warmth and tenderness present on right proximal knee Pain in right knee with extension  Neurological: He is alert and oriented to person, place, and time.  Skin: Skin is warm and dry. No rash noted. No erythema.  Psychiatric: He has a normal mood and affect. His behavior is normal. Judgment and thought content normal.  Vitals reviewed.  X-ray- Negative, fluid present Preliminary reading by Jannifer Rodneyhristy , FNP [facility]   BP 125/83    Pulse 84   Temp 97.8 F (36.6 C) (Oral)   Ht 5\' 10"  (1.778 m)   Wt 251 lb (113.9 kg)   BMI 36.01 kg/m      Assessment & Plan:  1. Pain and swelling of right knee - DG Knee 1-2 Views Right; Future - Arthritis Panel - amoxicillin-clavulanate (AUGMENTIN) 875-125 MG tablet; Take 1 tablet by mouth 2 (two) times daily.  Dispense: 20 tablet; Refill: 0  2. Cellulitis of right lower extremity Keep elevated Rest Ice Report any increase in redness and swelling RTO in 10 days - Arthritis Panel   Jannifer Rodneyhristy , FNP

## 2017-05-14 NOTE — Patient Instructions (Signed)

## 2017-05-15 LAB — ARTHRITIS PANEL
BASOS: 0 %
Basophils Absolute: 0 10*3/uL (ref 0.0–0.2)
EOS (ABSOLUTE): 0.1 10*3/uL (ref 0.0–0.4)
Eos: 2 %
HEMATOCRIT: 43.7 % (ref 37.5–51.0)
HEMOGLOBIN: 15.4 g/dL (ref 13.0–17.7)
IMMATURE GRANS (ABS): 0 10*3/uL (ref 0.0–0.1)
Immature Granulocytes: 0 %
LYMPHS ABS: 1.2 10*3/uL (ref 0.7–3.1)
LYMPHS: 19 %
MCH: 30.6 pg (ref 26.6–33.0)
MCHC: 35.2 g/dL (ref 31.5–35.7)
MCV: 87 fL (ref 79–97)
MONOCYTES: 11 %
Monocytes Absolute: 0.7 10*3/uL (ref 0.1–0.9)
NEUTROS ABS: 4.4 10*3/uL (ref 1.4–7.0)
Neutrophils: 68 %
Platelets: 173 10*3/uL (ref 150–379)
RBC: 5.03 x10E6/uL (ref 4.14–5.80)
RDW: 15 % (ref 12.3–15.4)
Rhuematoid fact SerPl-aCnc: <10 IU/mL (ref 0.0–13.9)
SED RATE: 5 mm/h (ref 0–30)
Uric Acid: 7.7 mg/dL (ref 3.7–8.6)
WBC: 6.5 10*3/uL (ref 3.4–10.8)

## 2017-05-17 ENCOUNTER — Telehealth: Payer: Self-pay | Admitting: Family Medicine

## 2017-05-17 NOTE — Telephone Encounter (Signed)
Okay to extend his work note for another week if needed

## 2017-05-19 NOTE — Telephone Encounter (Signed)
Letter faxed.  Wife aware

## 2017-08-23 ENCOUNTER — Ambulatory Visit (INDEPENDENT_AMBULATORY_CARE_PROVIDER_SITE_OTHER): Payer: 59 | Admitting: Physician Assistant

## 2017-08-23 ENCOUNTER — Encounter: Payer: Self-pay | Admitting: Physician Assistant

## 2017-08-23 VITALS — BP 130/78 | HR 88 | Temp 98.9°F | Ht 70.0 in | Wt 258.4 lb

## 2017-08-23 DIAGNOSIS — G8929 Other chronic pain: Secondary | ICD-10-CM

## 2017-08-23 DIAGNOSIS — M25469 Effusion, unspecified knee: Secondary | ICD-10-CM

## 2017-08-23 DIAGNOSIS — M25562 Pain in left knee: Secondary | ICD-10-CM | POA: Diagnosis not present

## 2017-08-23 MED ORDER — MELOXICAM 7.5 MG PO TABS: 7.5000 mg | ORAL_TABLET | Freq: Every day | ORAL | 0 refills | Status: DC

## 2017-08-23 MED ORDER — ECONAZOLE NITRATE 1 % EX CREA: TOPICAL_CREAM | Freq: Every day | CUTANEOUS | 0 refills | Status: DC

## 2017-08-23 MED ORDER — METHYLPREDNISOLONE ACETATE 80 MG/ML IJ SUSP
80.0000 mg | Freq: Once | INTRAMUSCULAR | Status: AC
  Administered 2017-08-23: 80 mg via INTRAMUSCULAR

## 2017-08-23 NOTE — Progress Notes (Signed)
BP 130/78   Pulse 88   Temp 98.9 F (37.2 C) (Oral)   Ht 5\' 10"  (1.778 m)   Wt 258 lb 6.4 oz (117.2 kg)   BMI 37.08 kg/m    Subjective:    Patient ID: Wayne Munoz, male    DOB: 07/28/1956, 61 y.o.   MRN: [id number]  HPI: Wayne GenerousDarryl Saran is a 61 y.o. male presenting on 08/23/2017 for Knee Pain (left )  This patient comes in with significant left knee pain.  A few months ago he had problems with his right knee.  It did respond to colchicine.  He had some leftover colchicine and when this 1 started bothering him to use the medication but it has not helped at all.  He has had significant swelling.  It is even swollen to the back.  He denies any specific new injury.  He admits to lifelong overuse situations.  Many years ago he had this left knee evaluated and drained a Baker's cyst in the past.  We have had a long discussion about the chronicity of this knee and the likelihood he will need orthopedic evaluation.  He agrees to setting up an appointment.  Relevant past medical, surgical, family and social history reviewed and updated as indicated. Allergies and medications reviewed and updated.  History reviewed. No pertinent past medical history.  Past Surgical History:  Procedure Laterality Date  . KNEE ARTHROSCOPY Right     Review of Systems  Constitutional: Negative.  Negative for appetite change and fatigue.  Eyes: Negative for pain and visual disturbance.  Respiratory: Negative.  Negative for cough, chest tightness, shortness of breath and wheezing.   Cardiovascular: Negative.  Negative for chest pain, palpitations and leg swelling.  Gastrointestinal: Negative.  Negative for abdominal pain, diarrhea, nausea and vomiting.  Genitourinary: Negative.   Musculoskeletal: Positive for arthralgias, gait problem and joint swelling.  Skin: Negative.  Negative for color change and rash.  Neurological: Negative for weakness, numbness and headaches.  Psychiatric/Behavioral: Negative.      Allergies as of 08/23/2017   No Known Allergies     Medication List       Accurate as of 08/23/17 10:58 AM. Always use your most recent med list.          calcipotriene-betamethasone ointment Commonly known as:  TACLONEX APPLY TO AFFECTED AREA TWICE A DAY   colchicine 0.6 MG tablet Take 1.2 mg today then 0.6 mg one hour later. Max 1.8 mg/day   econazole nitrate 1 % cream Apply topically daily.   ibuprofen 200 MG tablet Commonly known as:  ADVIL,MOTRIN Take 200 mg by mouth every 6 (six) hours as needed.   meloxicam 7.5 MG tablet Commonly known as:  MOBIC Take 1 tablet (7.5 mg total) by mouth daily.          Objective:    BP 130/78   Pulse 88   Temp 98.9 F (37.2 C) (Oral)   Ht 5\' 10"  (1.778 m)   Wt 258 lb 6.4 oz (117.2 kg)   BMI 37.08 kg/m   No Known Allergies  Physical Exam  Constitutional: He appears well-developed and well-nourished. No distress.  HENT:  Head: Normocephalic and atraumatic.  Eyes: Pupils are equal, round, and reactive to light. Conjunctivae and EOM are normal.  Cardiovascular: Normal rate, regular rhythm and normal heart sounds.   Pulmonary/Chest: Effort normal and breath sounds normal. No respiratory distress.  Musculoskeletal:       Left knee: He exhibits decreased range of  motion and swelling. He exhibits no deformity, no erythema, no LCL laxity, normal patellar mobility and no MCL laxity. Tenderness found. Lateral joint line and patellar tendon tenderness noted.       Legs: Skin: Skin is warm and dry.  Psychiatric: He has a normal mood and affect. His behavior is normal.  Nursing note and vitals reviewed.       Assessment & Plan:   1. Chronic pain of left knee - methylPREDNISolone acetate (DEPO-MEDROL) injection 80 mg; Inject 1 mL (80 mg total) into the muscle once. - Ambulatory referral to Orthopedic Surgery  2. Knee swelling - methylPREDNISolone acetate (DEPO-MEDROL) injection 80 mg; Inject 1 mL (80 mg total) into the  muscle once. - Ambulatory referral to Orthopedic Surgery    Current Outpatient Prescriptions:  .  calcipotriene-betamethasone (TACLONEX) ointment, APPLY TO AFFECTED AREA TWICE A DAY, Disp: , Rfl: 2 .  colchicine 0.6 MG tablet, Take 1.2 mg today then 0.6 mg one hour later. Max 1.8 mg/day, Disp: 30 tablet, Rfl: 2 .  ibuprofen (ADVIL,MOTRIN) 200 MG tablet, Take 200 mg by mouth every 6 (six) hours as needed., Disp: , Rfl:  .  econazole nitrate 1 % cream, Apply topically daily., Disp: 30 g, Rfl: 0 .  meloxicam (MOBIC) 7.5 MG tablet, Take 1 tablet (7.5 mg total) by mouth daily., Disp: 30 tablet, Rfl: 0 Continue all other maintenance medications as listed above.  Follow up plan: Return if symptoms worsen or fail to improve.  Educational handout given for survye  Remus Loffler PA-C [facility] [address]  [place], [place] [address] [phone]   08/23/2017, 10:58 AM

## 2017-08-23 NOTE — Patient Instructions (Signed)
In a few days you may receive a survey in the mail or online from [employer] regarding your visit with us today. Please take a moment to fill this out. Your feedback is very important to our whole office. It can help us better understand your needs as well as improve your experience and satisfaction. Thank you for taking your time to complete it. We care about you.   , PA-C  

## 2017-09-19 ENCOUNTER — Other Ambulatory Visit: Payer: Self-pay | Admitting: Physician Assistant

## 2017-09-20 ENCOUNTER — Other Ambulatory Visit: Payer: Self-pay

## 2017-09-20 MED ORDER — MELOXICAM 7.5 MG PO TABS: 7.5000 mg | ORAL_TABLET | Freq: Every day | ORAL | 0 refills | Status: DC

## 2018-02-04 IMAGING — DX DG KNEE 1-2V*R*
2 series · 2 of 2 positions shown · non-contrast
Comparison: None.

CLINICAL DATA: Pain, swelling right knee

EXAM:
RIGHT KNEE - 1-2 VIEW

[knee ap]
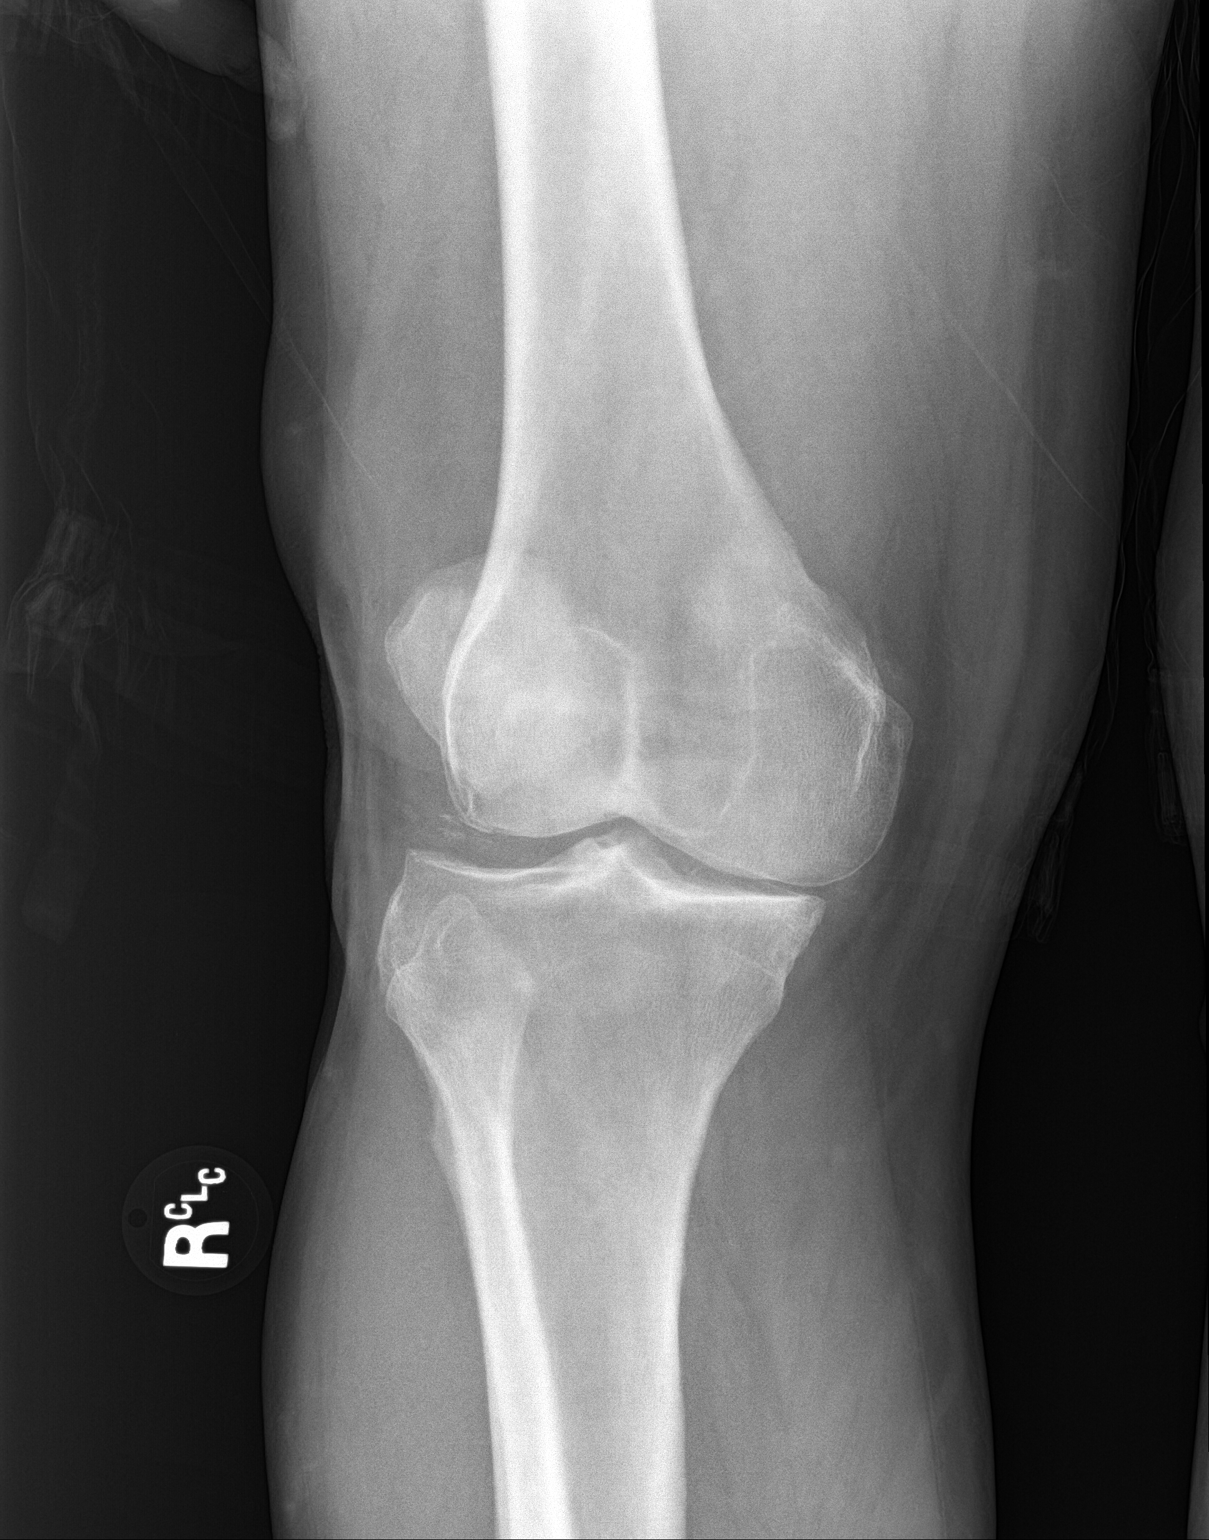

[knee lat]
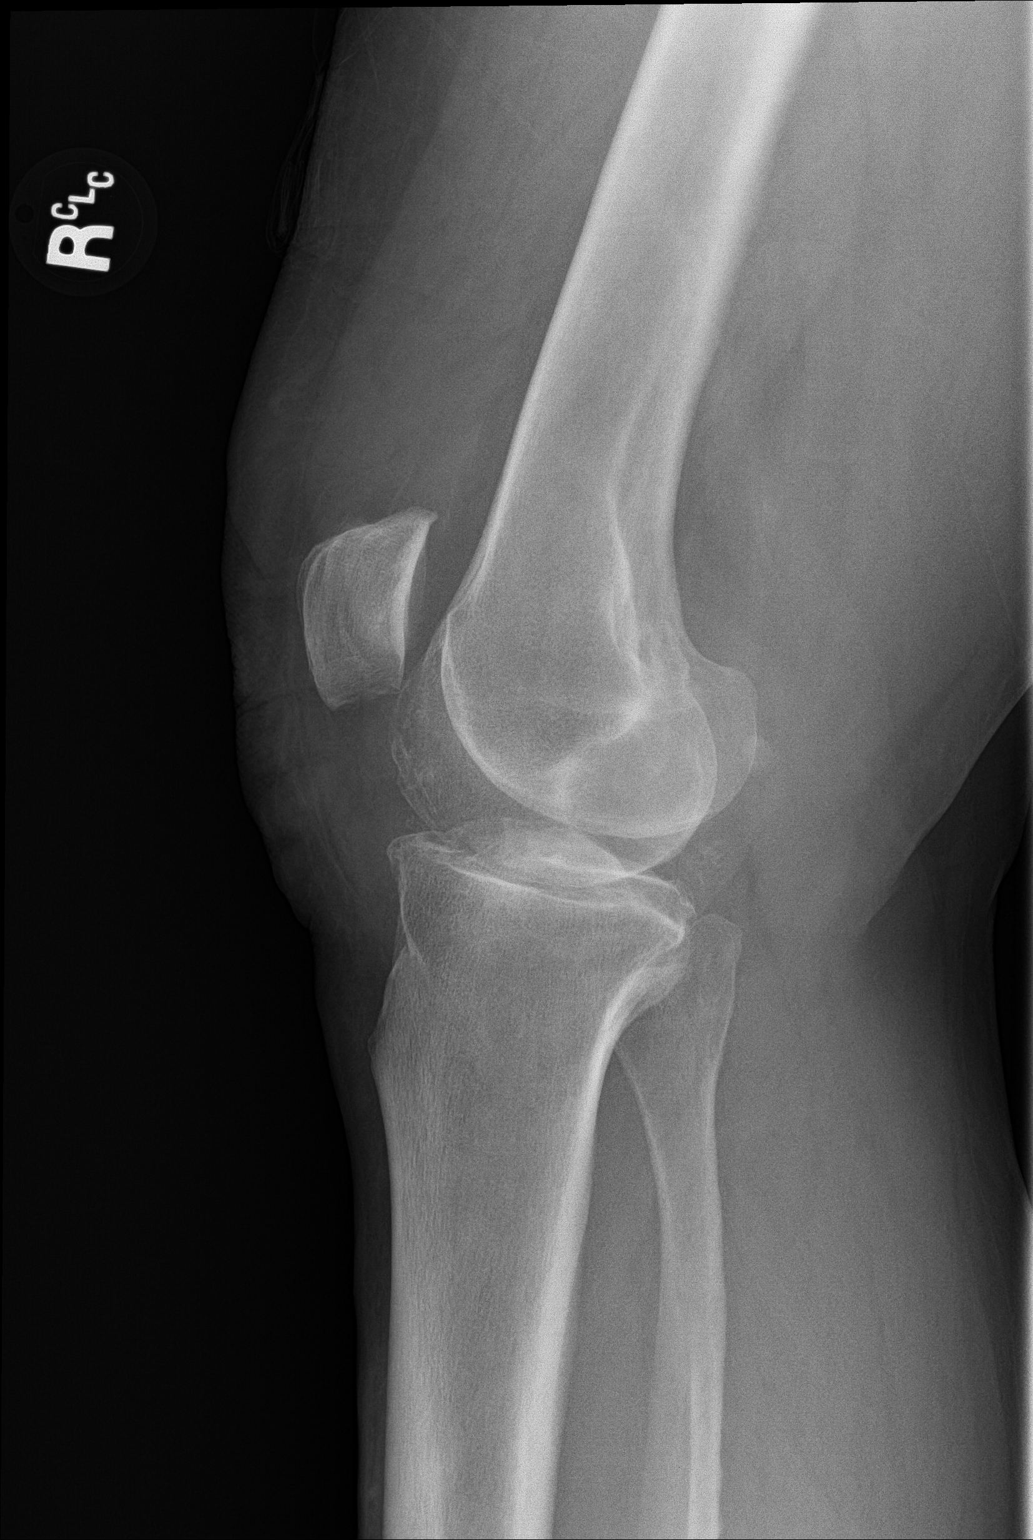

[2 of 2 positions shown; findings below may reference images not displayed]

FINDINGS: Chondrocalcinosis in the right knee. Medial joint space narrowing.
Mild spurring throughout the knee. No joint effusion. No acute bony
abnormality. Specifically, no fracture, subluxation, or dislocation.
Soft tissues are intact.
IMPRESSION: Mild chondrocalcinosis and degenerative changes. No acute bony
abnormality.

## 2019-10-02 DIAGNOSIS — Z1159 Encounter for screening for other viral diseases: Secondary | ICD-10-CM | POA: Diagnosis not present

## 2019-10-20 DIAGNOSIS — Z1159 Encounter for screening for other viral diseases: Secondary | ICD-10-CM | POA: Diagnosis not present

## 2019-10-25 DIAGNOSIS — D122 Benign neoplasm of ascending colon: Secondary | ICD-10-CM | POA: Diagnosis not present

## 2019-10-25 DIAGNOSIS — Z1211 Encounter for screening for malignant neoplasm of colon: Secondary | ICD-10-CM | POA: Diagnosis not present

## 2020-01-30 DIAGNOSIS — Z85828 Personal history of other malignant neoplasm of skin: Secondary | ICD-10-CM | POA: Diagnosis not present

## 2020-01-30 DIAGNOSIS — L814 Other melanin hyperpigmentation: Secondary | ICD-10-CM | POA: Diagnosis not present

## 2020-01-30 DIAGNOSIS — Z79899 Other long term (current) drug therapy: Secondary | ICD-10-CM | POA: Diagnosis not present

## 2020-01-30 DIAGNOSIS — L821 Other seborrheic keratosis: Secondary | ICD-10-CM | POA: Diagnosis not present

## 2020-01-30 DIAGNOSIS — L400 Psoriasis vulgaris: Secondary | ICD-10-CM | POA: Diagnosis not present

## 2020-01-30 DIAGNOSIS — D1801 Hemangioma of skin and subcutaneous tissue: Secondary | ICD-10-CM | POA: Diagnosis not present

## 2020-02-13 DIAGNOSIS — L400 Psoriasis vulgaris: Secondary | ICD-10-CM | POA: Diagnosis not present

## 2020-04-16 DIAGNOSIS — D485 Neoplasm of uncertain behavior of skin: Secondary | ICD-10-CM | POA: Diagnosis not present

## 2020-04-16 DIAGNOSIS — L905 Scar conditions and fibrosis of skin: Secondary | ICD-10-CM | POA: Diagnosis not present

## 2020-04-16 DIAGNOSIS — L570 Actinic keratosis: Secondary | ICD-10-CM | POA: Diagnosis not present

## 2020-04-16 DIAGNOSIS — L400 Psoriasis vulgaris: Secondary | ICD-10-CM | POA: Diagnosis not present

## 2020-04-16 DIAGNOSIS — L814 Other melanin hyperpigmentation: Secondary | ICD-10-CM | POA: Diagnosis not present

## 2020-04-16 DIAGNOSIS — L281 Prurigo nodularis: Secondary | ICD-10-CM | POA: Diagnosis not present

## 2020-04-29 ENCOUNTER — Other Ambulatory Visit: Payer: Self-pay | Admitting: Family Medicine

## 2020-04-29 DIAGNOSIS — Z125 Encounter for screening for malignant neoplasm of prostate: Secondary | ICD-10-CM

## 2020-04-29 DIAGNOSIS — Z130 Encounter for screening for diseases of the blood and blood-forming organs and certain disorders involving the immune mechanism: Secondary | ICD-10-CM

## 2020-04-29 DIAGNOSIS — Z1159 Encounter for screening for other viral diseases: Secondary | ICD-10-CM

## 2020-04-29 DIAGNOSIS — Z114 Encounter for screening for human immunodeficiency virus [HIV]: Secondary | ICD-10-CM

## 2020-04-30 ENCOUNTER — Other Ambulatory Visit: Payer: Self-pay

## 2020-04-30 ENCOUNTER — Other Ambulatory Visit: Payer: BC Managed Care – PPO

## 2020-04-30 DIAGNOSIS — Z1159 Encounter for screening for other viral diseases: Secondary | ICD-10-CM | POA: Diagnosis not present

## 2020-04-30 DIAGNOSIS — Z125 Encounter for screening for malignant neoplasm of prostate: Secondary | ICD-10-CM | POA: Diagnosis not present

## 2020-04-30 DIAGNOSIS — Z114 Encounter for screening for human immunodeficiency virus [HIV]: Secondary | ICD-10-CM

## 2020-04-30 DIAGNOSIS — Z130 Encounter for screening for diseases of the blood and blood-forming organs and certain disorders involving the immune mechanism: Secondary | ICD-10-CM

## 2020-04-30 LAB — BAYER DCA HB A1C WAIVED: HB A1C (BAYER DCA - WAIVED): 5.8 % (ref <7.0)

## 2020-05-01 LAB — CMP14+EGFR
ALT: 18 IU/L (ref 0–44)
AST: 20 IU/L (ref 0–40)
Albumin/Globulin Ratio: 1.4 (ref 1.2–2.2)
Albumin: 4.2 g/dL (ref 3.8–4.8)
Alkaline Phosphatase: 93 IU/L (ref 48–121)
BUN/Creatinine Ratio: 13 (ref 10–24)
BUN: 15 mg/dL (ref 8–27)
Bilirubin Total: 1.6 mg/dL — ABNORMAL HIGH (ref 0.0–1.2)
CO2: 23 mmol/L (ref 20–29)
Calcium: 9.7 mg/dL (ref 8.6–10.2)
Chloride: 99 mmol/L (ref 96–106)
Creatinine, Ser: 1.17 mg/dL (ref 0.76–1.27)
GFR calc Af Amer: 77 mL/min/{1.73_m2} (ref >59)
GFR calc non Af Amer: 66 mL/min/{1.73_m2} (ref >59)
Globulin, Total: 3 g/dL (ref 1.5–4.5)
Glucose: 127 mg/dL — ABNORMAL HIGH (ref 65–99)
Potassium: 4.2 mmol/L (ref 3.5–5.2)
Sodium: 138 mmol/L (ref 134–144)
Total Protein: 7.2 g/dL (ref 6.0–8.5)

## 2020-05-01 LAB — PSA: Prostate Specific Ag, Serum: 0.4 ng/mL (ref 0.0–4.0)

## 2020-05-01 LAB — CBC
Hematocrit: 44 % (ref 37.5–51.0)
Hemoglobin: 15.4 g/dL (ref 13.0–17.7)
MCH: 30.6 pg (ref 26.6–33.0)
MCHC: 35 g/dL (ref 31.5–35.7)
MCV: 87 fL (ref 79–97)
Platelets: 157 10*3/uL (ref 150–450)
RBC: 5.04 x10E6/uL (ref 4.14–5.80)
RDW: 13.2 % (ref 11.6–15.4)
WBC: 4.5 10*3/uL (ref 3.4–10.8)

## 2020-05-01 LAB — LIPID PANEL
Chol/HDL Ratio: 3.8 ratio (ref 0.0–5.0)
Cholesterol, Total: 173 mg/dL (ref 100–199)
HDL: 45 mg/dL (ref >39)
LDL Chol Calc (NIH): 100 mg/dL — ABNORMAL HIGH (ref 0–99)
Triglycerides: 163 mg/dL — ABNORMAL HIGH (ref 0–149)
VLDL Cholesterol Cal: 28 mg/dL (ref 5–40)

## 2020-05-01 LAB — TSH: TSH: 4.59 u[IU]/mL — ABNORMAL HIGH (ref 0.450–4.500)

## 2020-05-01 LAB — HIV ANTIBODY (ROUTINE TESTING W REFLEX): HIV Screen 4th Generation wRfx: NONREACTIVE

## 2020-05-01 LAB — HEPATITIS C ANTIBODY: Hep C Virus Ab: <0.1 s/co ratio (ref 0.0–0.9)

## 2020-05-03 ENCOUNTER — Ambulatory Visit (INDEPENDENT_AMBULATORY_CARE_PROVIDER_SITE_OTHER): Payer: BC Managed Care – PPO | Admitting: Family Medicine

## 2020-05-03 ENCOUNTER — Other Ambulatory Visit: Payer: Self-pay

## 2020-05-03 ENCOUNTER — Encounter: Payer: Self-pay | Admitting: Family Medicine

## 2020-05-03 VITALS — BP 134/70 | HR 59 | Temp 97.3°F | Ht 70.0 in | Wt 259.0 lb

## 2020-05-03 DIAGNOSIS — Z0000 Encounter for general adult medical examination without abnormal findings: Secondary | ICD-10-CM

## 2020-05-03 DIAGNOSIS — R7989 Other specified abnormal findings of blood chemistry: Secondary | ICD-10-CM | POA: Diagnosis not present

## 2020-05-03 DIAGNOSIS — E782 Mixed hyperlipidemia: Secondary | ICD-10-CM

## 2020-05-03 DIAGNOSIS — Z0001 Encounter for general adult medical examination with abnormal findings: Secondary | ICD-10-CM

## 2020-05-03 DIAGNOSIS — R739 Hyperglycemia, unspecified: Secondary | ICD-10-CM

## 2020-05-03 DIAGNOSIS — E669 Obesity, unspecified: Secondary | ICD-10-CM

## 2020-05-03 LAB — BAYER DCA HB A1C WAIVED: HB A1C (BAYER DCA - WAIVED): 5.5 % (ref <7.0)

## 2020-05-03 NOTE — Progress Notes (Signed)
Wayne Munoz is a 64 y.o. male presents to office today for annual physical exam examination.    Concerns today include: 1. Needs forms for insurance  Occupation: retired from [employer], Marital status: married, Substance use: none Diet: typical [place], Exercise: no structured Last colonoscopy: 2021, [facility], ROI completed Refills needed today: n/a Immunizations needed: recently vaccinated for COVID, hold off on TDap  Past Medical History:  Diagnosis Date   Psoriasis    Social History   Socioeconomic History   Marital status: Married    Spouse name: Not on file   Number of children: Not on file   Years of education: Not on file   Highest education level: Not on file  Occupational History   Occupation: Retired  Tobacco Use   Smoking status: Never Smoker   Smokeless tobacco: Never Used  Building services engineer Use: Never used  Substance and Sexual Activity   Alcohol use: No    Alcohol/week: 0.0 standard drinks   Drug use: No   Sexual activity: Not on file  Other Topics Concern   Not on file  Social History Narrative   Wife, Wayne Munoz   He is retired from [employer]   Social Determinants of Corporate investment banker Strain:    Difficulty of Paying Living Expenses:   Food Insecurity:    Worried About Programme researcher, broadcasting/film/video in the Last Year:    Barista in the Last Year:   Transportation Needs:    Freight forwarder (Medical):    Lack of Transportation (Non-Medical):   Physical Activity:    Days of Exercise per Week:    Minutes of Exercise per Session:   Stress:    Feeling of Stress :   Social Connections:    Frequency of Communication with Friends and Family:    Frequency of Social Gatherings with Friends and Family:    Attends Religious Services:    Active Member of Clubs or Organizations:    Attends Engineer, structural:    Marital Status:   Intimate Partner Violence:    Fear of Current or Ex-Partner:     Emotionally Abused:    Physically Abused:    Sexually Abused:    Past Surgical History:  Procedure Laterality Date   KNEE ARTHROSCOPY Right    History reviewed. No pertinent family history.  Current Outpatient Medications:    TREMFYA 100 MG/ML SOPN, Inject into the skin., Disp: , Rfl:    ibuprofen (ADVIL,MOTRIN) 200 MG tablet, Take 200 mg by mouth every 6 (six) hours as needed., Disp: , Rfl:   No Known Allergies   ROS: Review of Systems Constitutional: negative Eyes: negative Ears, nose, mouth, throat, and face: negative Respiratory: negative Cardiovascular: negative Gastrointestinal: negative Genitourinary:negative Integument/breast: positive for psoriasis that is well controlled with Tremfya Hematologic/lymphatic: negative Musculoskeletal:negative Neurological: negative Behavioral/Psych: negative Endocrine: negative Allergic/Immunologic: negative    Physical exam BP 134/70    Pulse (!) 59    Temp (!) 97.3 F (36.3 C) (Temporal)    Ht 5\' 10"  (1.778 m)    Wt 259 lb (117.5 kg)    SpO2 98%    BMI 37.16 kg/m  General appearance: alert, cooperative, appears stated age, no distress and mildly obese Head: Normocephalic, without obvious abnormality, atraumatic Eyes: negative findings: lids and lashes normal, conjunctivae and sclerae normal, corneas clear and pupils equal, round, reactive to light and accomodation Ears: normal TM's and external ear canals both ears Nose: Nares normal.  Septum midline. Mucosa normal. No drainage or sinus tenderness. Throat: lips, mucosa, and tongue normal; teeth and gums normal Neck: no adenopathy, no carotid bruit, supple, symmetrical, trachea midline and thyroid not enlarged, symmetric, no tenderness/mass/nodules Back: symmetric, no curvature. ROM normal. No CVA tenderness. Lungs: clear to auscultation bilaterally Chest wall: no tenderness Heart: regular rate and rhythm, S1, S2 normal, no murmur, click, rub or gallop Abdomen: soft,  non-tender; bowel sounds normal; no masses,  no organomegaly Extremities: extremities normal, atraumatic, no cyanosis or edema Pulses: 2+ and symmetric Skin: some areas of skin damage and healed scarring. no suspcious lesions Lymph nodes: Cervical, supraclavicular, and axillary nodes normal. Neurologic: Alert and oriented X 3, normal strength and tone. Normal symmetric reflexes. Normal coordination and gait Psych: Mood stable, speech normal, affect appropriate, pleasant and interactive.  The 10-year ASCVD risk score Denman George DC Montez Hageman., et al., 2013) is: 10.4%   Values used to calculate the score:     Age: 11 years     Sex: Male     Is Non-Hispanic African American: No     Diabetic: No     Tobacco smoker: No     Systolic Blood Pressure: 134 mmHg     Is BP treated: No     HDL Cholesterol: 45 mg/dL     Total Cholesterol: 173 mg/dL   Assessment/ Plan: Wayne Munoz here for annual physical exam.   1. Annual physical exam Discussed lifestyle modification.  Reinforce Mediterranean diet for weight loss and improve cardiovascular health  2. Mixed hyperlipidemia ASCVD risk score of 10.4%.  I would like him to work on lifestyle modification as above before pursuing cholesterol medication.  We will plan for repeat lipid panel in 3 months. - Lipid panel; Future  3. Obesity (BMI 30-39.9) As above  4. Abnormal TSH Mildly abnormal.  He recently got the Covid vaccination which may explain.  Repeat thyroid in 3 months - Thyroid Panel With TSH; Future  5. Elevated serum glucose Noted to be 127 on recent fasting labs.  We will check an A1c here today and repeat again in 3 months - Bayer DCA Hb A1c Waived; Future - Bayer DCA Hb A1c Waived   Counseled on healthy lifestyle choices, including diet (rich in fruits, vegetables and lean meats and low in salt and simple carbohydrates) and exercise (at least 30 minutes of moderate physical activity daily).  Patient to follow up in 60m for labs and 1  year for annual exam or sooner if needed.   M. Nadine Counts, DO

## 2020-05-24 ENCOUNTER — Encounter: Payer: 59 | Admitting: Family Medicine

## 2020-08-06 ENCOUNTER — Ambulatory Visit: Payer: BC Managed Care – PPO | Admitting: Family Medicine

## 2020-08-06 NOTE — Progress Notes (Deleted)
Subjective: CC:*** PCP: Raliegh Ip, DO OIZ:[id number] [name] is a 64 y.o. male presenting to clinic today for:  1. ***   ROS: Per HPI  No Known Allergies Past Medical History:  Diagnosis Date  . Psoriasis     Current Outpatient Medications:  .  ibuprofen (ADVIL,MOTRIN) 200 MG tablet, Take 200 mg by mouth every 6 (six) hours as needed., Disp: , Rfl:  .  TREMFYA 100 MG/ML SOPN, Inject into the skin., Disp: , Rfl:  Social History   Socioeconomic History  . Marital status: Married    Spouse name: Not on file  . Number of children: Not on file  . Years of education: Not on file  . Highest education level: Not on file  Occupational History  . Occupation: Retired  Tobacco Use  . Smoking status: Never Smoker  . Smokeless tobacco: Never Used  Vaping Use  . Vaping Use: Never used  Substance and Sexual Activity  . Alcohol use: No    Alcohol/week: 0.0 standard drinks  . Drug use: No  . Sexual activity: Not on file  Other Topics Concern  . Not on file  Social History Narrative   Wife, Wayne Munoz   He is retired from [employer]:   . Difficulty of Paying Living Expenses: Not on file  Food Insecurity:   . Worried About Programme researcher, broadcasting/film/video in the Last Year: Not on file  . Ran Out of Food in the Last Year: Not on file  Transportation Needs:   . Lack of Transportation (Medical): Not on file  . Lack of Transportation (Non-Medical): Not on file  Physical Activity:   . Days of Exercise per Week: Not on file  . Minutes of Exercise per Session: Not on file  Stress:   . Feeling of Stress : Not on file  Social Connections:   . Frequency of Communication with Friends and Family: Not on file  . Frequency of Social Gatherings with Friends and Family: Not on file  . Attends Religious Services: Not on file  . Active Member of Clubs or Organizations: Not on file  . Attends Banker Meetings: Not on file  .  Marital Status: Not on file  Intimate Partner Violence:   . Fear of Current or Ex-Partner: Not on file  . Emotionally Abused: Not on file  . Physically Abused: Not on file  . Sexually Abused: Not on file   No family history on file.  Objective: Office vital signs reviewed. There were no vitals taken for this visit.  Physical Examination:  General: Awake, alert, *** nourished, No acute distress HEENT: Normal    Neck: No masses palpated. No lymphadenopathy    Ears: Tympanic membranes intact, normal light reflex, no erythema, no bulging    Eyes: PERRLA, extraocular membranes intact, sclera ***    Nose: nasal turbinates moist, *** nasal discharge    Throat: moist mucus membranes, no erythema, *** tonsillar exudate.  Airway is patent Cardio: regular rate and rhythm, S1S2 heard, no murmurs appreciated Pulm: clear to auscultation bilaterally, no wheezes, rhonchi or rales; normal work of breathing on room air GI: soft, non-tender, non-distended, bowel sounds present x4, no hepatomegaly, no splenomegaly, no masses GU: external vaginal tissue ***, cervix ***, *** punctate lesions on cervix appreciated, *** discharge from cervical os, *** bleeding, *** cervical motion tenderness, *** abdominal/ adnexal masses Extremities: warm, well perfused, No edema, cyanosis or clubbing; +*** pulses  bilaterally MSK: *** gait and *** station Skin: dry; intact; no rashes or lesions Neuro: *** Strength and light touch sensation grossly intact, *** DTRs ***/4  The 10-year ASCVD risk score Denman George DC Jr., et al., 2013) is: 10.4%   Values used to calculate the score:     Age: 60 years     Sex: Male     Is Non-Hispanic African American: No     Diabetic: No     Tobacco smoker: No     Systolic Blood Pressure: 134 mmHg     Is BP treated: No     HDL Cholesterol: 45 mg/dL     Total Cholesterol: 173 mg/dL  Assessment/ Plan: 64 y.o. male   ***  No orders of the defined types were placed in this  encounter.  No orders of the defined types were placed in this encounter.    Raliegh Ip, DO [facility] [phone]

## 2020-08-30 ENCOUNTER — Encounter: Payer: BC Managed Care – PPO | Admitting: Nurse Practitioner

## 2020-08-30 DIAGNOSIS — J9801 Acute bronchospasm: Secondary | ICD-10-CM | POA: Diagnosis not present

## 2020-08-30 DIAGNOSIS — J0100 Acute maxillary sinusitis, unspecified: Secondary | ICD-10-CM | POA: Diagnosis not present

## 2020-08-30 DIAGNOSIS — R059 Cough, unspecified: Secondary | ICD-10-CM | POA: Diagnosis not present

## 2020-08-30 NOTE — Progress Notes (Signed)
Patient went to urgent care

## 2021-03-03 DIAGNOSIS — Z79899 Other long term (current) drug therapy: Secondary | ICD-10-CM | POA: Diagnosis not present

## 2021-03-03 DIAGNOSIS — L400 Psoriasis vulgaris: Secondary | ICD-10-CM | POA: Diagnosis not present

## 2021-03-28 DIAGNOSIS — L579 Skin changes due to chronic exposure to nonionizing radiation, unspecified: Secondary | ICD-10-CM | POA: Diagnosis not present

## 2021-03-28 DIAGNOSIS — L814 Other melanin hyperpigmentation: Secondary | ICD-10-CM | POA: Diagnosis not present

## 2021-03-28 DIAGNOSIS — L570 Actinic keratosis: Secondary | ICD-10-CM | POA: Diagnosis not present

## 2021-03-28 DIAGNOSIS — L400 Psoriasis vulgaris: Secondary | ICD-10-CM | POA: Diagnosis not present

## 2021-12-19 ENCOUNTER — Other Ambulatory Visit: Payer: Self-pay | Admitting: Family Medicine

## 2021-12-19 ENCOUNTER — Telehealth: Payer: Self-pay | Admitting: Family Medicine

## 2021-12-19 DIAGNOSIS — Z125 Encounter for screening for malignant neoplasm of prostate: Secondary | ICD-10-CM

## 2021-12-19 DIAGNOSIS — R739 Hyperglycemia, unspecified: Secondary | ICD-10-CM

## 2021-12-19 DIAGNOSIS — R7989 Other specified abnormal findings of blood chemistry: Secondary | ICD-10-CM

## 2021-12-19 DIAGNOSIS — E669 Obesity, unspecified: Secondary | ICD-10-CM

## 2021-12-19 DIAGNOSIS — E782 Mixed hyperlipidemia: Secondary | ICD-10-CM

## 2021-12-19 DIAGNOSIS — Z130 Encounter for screening for diseases of the blood and blood-forming organs and certain disorders involving the immune mechanism: Secondary | ICD-10-CM

## 2021-12-19 NOTE — Telephone Encounter (Signed)
What labs would you like ordered?

## 2021-12-19 NOTE — Telephone Encounter (Signed)
Ordered. Come fasting. No intercourse for 24 hours before labs.  We'll be checking his prostate level through blood work.

## 2021-12-19 NOTE — Telephone Encounter (Signed)
Wayne Munoz aware. 

## 2021-12-22 ENCOUNTER — Other Ambulatory Visit: Payer: BC Managed Care – PPO

## 2021-12-22 DIAGNOSIS — Z130 Encounter for screening for diseases of the blood and blood-forming organs and certain disorders involving the immune mechanism: Secondary | ICD-10-CM

## 2021-12-22 DIAGNOSIS — Z125 Encounter for screening for malignant neoplasm of prostate: Secondary | ICD-10-CM | POA: Diagnosis not present

## 2021-12-22 DIAGNOSIS — E782 Mixed hyperlipidemia: Secondary | ICD-10-CM

## 2021-12-22 DIAGNOSIS — R739 Hyperglycemia, unspecified: Secondary | ICD-10-CM | POA: Diagnosis not present

## 2021-12-22 DIAGNOSIS — E669 Obesity, unspecified: Secondary | ICD-10-CM

## 2021-12-22 DIAGNOSIS — R7989 Other specified abnormal findings of blood chemistry: Secondary | ICD-10-CM

## 2021-12-22 LAB — BAYER DCA HB A1C WAIVED: HB A1C (BAYER DCA - WAIVED): 5.4 % (ref 4.8–5.6)

## 2021-12-23 LAB — CMP14+EGFR
ALT: 20 IU/L (ref 0–44)
AST: 23 IU/L (ref 0–40)
Albumin/Globulin Ratio: 1.5 (ref 1.2–2.2)
Albumin: 4.3 g/dL (ref 3.8–4.8)
Alkaline Phosphatase: 85 IU/L (ref 44–121)
BUN/Creatinine Ratio: 9 — ABNORMAL LOW (ref 10–24)
BUN: 10 mg/dL (ref 8–27)
Bilirubin Total: 0.9 mg/dL (ref 0.0–1.2)
CO2: 23 mmol/L (ref 20–29)
Calcium: 10.1 mg/dL (ref 8.6–10.2)
Chloride: 102 mmol/L (ref 96–106)
Creatinine, Ser: 1.14 mg/dL (ref 0.76–1.27)
Globulin, Total: 2.8 g/dL (ref 1.5–4.5)
Glucose: 145 mg/dL — ABNORMAL HIGH (ref 70–99)
Potassium: 4.3 mmol/L (ref 3.5–5.2)
Sodium: 139 mmol/L (ref 134–144)
Total Protein: 7.1 g/dL (ref 6.0–8.5)
eGFR: 72 mL/min/{1.73_m2} (ref >59)

## 2021-12-23 LAB — LIPID PANEL
Chol/HDL Ratio: 3.6 ratio (ref 0.0–5.0)
Cholesterol, Total: 158 mg/dL (ref 100–199)
HDL: 44 mg/dL (ref >39)
LDL Chol Calc (NIH): 89 mg/dL (ref 0–99)
Triglycerides: 142 mg/dL (ref 0–149)
VLDL Cholesterol Cal: 25 mg/dL (ref 5–40)

## 2021-12-23 LAB — CBC
Hematocrit: 45.6 % (ref 37.5–51.0)
Hemoglobin: 15.8 g/dL (ref 13.0–17.7)
MCH: 30.3 pg (ref 26.6–33.0)
MCHC: 34.6 g/dL (ref 31.5–35.7)
MCV: 88 fL (ref 79–97)
Platelets: 174 10*3/uL (ref 150–450)
RBC: 5.21 x10E6/uL (ref 4.14–5.80)
RDW: 12.8 % (ref 11.6–15.4)
WBC: 3.9 10*3/uL (ref 3.4–10.8)

## 2021-12-23 LAB — T4, FREE: Free T4: 1.32 ng/dL (ref 0.82–1.77)

## 2021-12-23 LAB — PSA: Prostate Specific Ag, Serum: 0.5 ng/mL (ref 0.0–4.0)

## 2021-12-23 LAB — TSH: TSH: 2.6 u[IU]/mL (ref 0.450–4.500)

## 2021-12-24 ENCOUNTER — Ambulatory Visit (INDEPENDENT_AMBULATORY_CARE_PROVIDER_SITE_OTHER): Payer: BC Managed Care – PPO | Admitting: Family Medicine

## 2021-12-24 ENCOUNTER — Encounter: Payer: Self-pay | Admitting: Family Medicine

## 2021-12-24 VITALS — BP 140/90 | HR 64 | Temp 97.2°F | Ht 70.0 in | Wt 268.6 lb

## 2021-12-24 DIAGNOSIS — E782 Mixed hyperlipidemia: Secondary | ICD-10-CM | POA: Diagnosis not present

## 2021-12-24 DIAGNOSIS — Z0000 Encounter for general adult medical examination without abnormal findings: Secondary | ICD-10-CM

## 2021-12-24 DIAGNOSIS — R739 Hyperglycemia, unspecified: Secondary | ICD-10-CM | POA: Diagnosis not present

## 2021-12-24 DIAGNOSIS — Z0001 Encounter for general adult medical examination with abnormal findings: Secondary | ICD-10-CM

## 2021-12-24 DIAGNOSIS — G8929 Other chronic pain: Secondary | ICD-10-CM

## 2021-12-24 DIAGNOSIS — M25512 Pain in left shoulder: Secondary | ICD-10-CM | POA: Diagnosis not present

## 2021-12-24 MED ORDER — METHYLPREDNISOLONE ACETATE 40 MG/ML IJ SUSP
40.0000 mg | Freq: Once | INTRAMUSCULAR | Status: AC
  Administered 2021-12-24: 40 mg via INTRAMUSCULAR

## 2021-12-24 NOTE — Addendum Note (Signed)
Addended by: Fara Olden on: 12/24/2021 09:45 AM   Modules accepted: Orders

## 2021-12-24 NOTE — Patient Instructions (Signed)
Mediterranean Diet °A Mediterranean diet refers to food and lifestyle choices that are based on the traditions of countries located on the [place]. It focuses on eating more fruits, vegetables, whole grains, beans, nuts, seeds, and heart-healthy fats, and eating less dairy, meat, eggs, and processed foods with added sugar, salt, and fat. This way of eating has been shown to help prevent certain conditions and improve outcomes for people who have chronic diseases, like kidney disease and heart disease. °What are tips for following this plan? °Reading food labels °Check the serving size of packaged foods. For foods such as rice and pasta, the serving size refers to the amount of cooked product, not dry. °Check the total fat in packaged foods. Avoid foods that have saturated fat or trans fats. °Check the ingredient list for added sugars, such as corn syrup. °Shopping ° °Buy a variety of foods that offer a balanced diet, including: °Fresh fruits and vegetables (produce). °Grains, beans, nuts, and seeds. Some of these may be available in unpackaged forms or large amounts (in bulk). °Fresh seafood. °Poultry and eggs. °Low-fat dairy products. °Buy whole ingredients instead of prepackaged foods. °Buy fresh fruits and vegetables in-season from local farmers markets. °Buy plain frozen fruits and vegetables. °If you do not have access to quality fresh seafood, buy precooked frozen shrimp or canned fish, such as tuna, salmon, or sardines. °Stock your pantry so you always have certain foods on hand, such as olive oil, canned tuna, canned tomatoes, rice, pasta, and beans. °Cooking °Cook foods with extra-virgin olive oil instead of using butter or other vegetable oils. °Have meat as a side dish, and have vegetables or grains as your main dish. This means having meat in small portions or adding small amounts of meat to foods like pasta or stew. °Use beans or vegetables instead of meat in common dishes like chili or  lasagna. °Experiment with different cooking methods. Try roasting, broiling, steaming, and sautéing vegetables. °Add frozen vegetables to soups, stews, pasta, or rice. °Add nuts or seeds for added healthy fats and plant protein at each meal. You can add these to yogurt, salads, or vegetable dishes. °Marinate fish or vegetables using olive oil, lemon juice, garlic, and fresh herbs. °Meal planning °Plan to eat one vegetarian meal one day each week. Try to work up to two vegetarian meals, if possible. °Eat seafood two or more times a week. °Have healthy snacks readily available, such as: °Vegetable sticks with hummus. °Greek yogurt. °Fruit and nut trail mix. °Eat balanced meals throughout the week. This includes: °Fruit: 2-3 servings a day. °Vegetables: 4-5 servings a day. °Low-fat dairy: 2 servings a day. °Fish, poultry, or lean meat: 1 serving a day. °Beans and legumes: 2 or more servings a week. °Nuts and seeds: 1-2 servings a day. °Whole grains: 6-8 servings a day. °Extra-virgin olive oil: 3-4 servings a day. °Limit red meat and sweets to only a few servings a month. °Lifestyle ° °Cook and eat meals together with your family, when possible. °Drink enough fluid to keep your urine pale yellow. °Be physically active every day. This includes: °Aerobic exercise like running or swimming. °Leisure activities like gardening, walking, or housework. °Get 7-8 hours of sleep each night. °If recommended by your health care provider, drink red wine in moderation. This means 1 glass a day for nonpregnant women and 2 glasses a day for men. A glass of wine equals 5 oz (150 mL). °What foods should I eat? °Fruits °Apples. Apricots. Avocado. Berries. Bananas. Cherries. Dates.   Figs. Grapes. Lemons. Melon. Oranges. Peaches. Plums. Pomegranate. °Vegetables °Artichokes. Beets. Broccoli. Cabbage. Carrots. Eggplant. Green beans. Chard. Kale. Spinach. Onions. Leeks. Peas. Squash. Tomatoes. Peppers. Radishes. °Grains °Whole-grain pasta. Brown  rice. Bulgur wheat. Polenta. Couscous. Whole-wheat bread. Oatmeal. Quinoa. °Meats and other proteins °Beans. Almonds. Sunflower seeds. Pine nuts. Peanuts. Cod. Salmon. Scallops. Shrimp. Tuna. Tilapia. Clams. Oysters. Eggs. Poultry without skin. °Dairy °Low-fat milk. Cheese. Greek yogurt. °Fats and oils °Extra-virgin olive oil. Avocado oil. Grapeseed oil. °Beverages °Water. Red wine. Herbal tea. °Sweets and desserts °Greek yogurt with honey. Baked apples. Poached pears. Trail mix. °Seasonings and condiments °Basil. Cilantro. Coriander. Cumin. Mint. Parsley. Sage. Rosemary. Tarragon. Garlic. Oregano. Thyme. Pepper. Balsamic vinegar. Tahini. Hummus. Tomato sauce. Olives. Mushrooms. °The items listed above may not be a complete list of foods and beverages you can eat. Contact a dietitian for more information. °What foods should I limit? °This is a list of foods that should be eaten rarely or only on special occasions. °Fruits °Fruit canned in syrup. °Vegetables °Deep-fried potatoes (french fries). °Grains °Prepackaged pasta or rice dishes. Prepackaged cereal with added sugar. Prepackaged snacks with added sugar. °Meats and other proteins °Beef. Pork. Lamb. Poultry with skin. Hot dogs. Bacon. °Dairy °Ice cream. Sour cream. Whole milk. °Fats and oils °Butter. Canola oil. Vegetable oil. Beef fat (tallow). Lard. °Beverages °Juice. Sugar-sweetened soft drinks. Beer. Liquor and spirits. °Sweets and desserts °Cookies. Cakes. Pies. Candy. °Seasonings and condiments °Mayonnaise. Pre-made sauces and marinades. °The items listed above may not be a complete list of foods and beverages you should limit. Contact a dietitian for more information. °Summary °The Mediterranean diet includes both food and lifestyle choices. °Eat a variety of fresh fruits and vegetables, beans, nuts, seeds, and whole grains. °Limit the amount of red meat and sweets that you eat. °If recommended by your health care provider, drink red wine in moderation.  This means 1 glass a day for nonpregnant women and 2 glasses a day for men. A glass of wine equals 5 oz (150 mL). °This information is not intended to replace advice given to you by your health care provider. Make sure you discuss any questions you have with your health care provider. °Document Revised: 11/24/2019 Document Reviewed: 09/21/2019 °Elsevier Patient Education © 2022 Else[employer] Inc. ° °

## 2021-12-24 NOTE — Progress Notes (Addendum)
Wayne Munoz is a 66 y.o. male presents to office today for annual physical exam examination.    Concerns today include: 1.  Left shoulder pain Patient reports about a 3 to 32-monthhistory of left-sided shoulder pain.  He points to the entire left shoulder as the area of pain.  He is right-hand dominant.  No preceding injury.  He sees a massage therapist fairly regularly and notes that the pain does get slightly better after that but it always returns.  He denies any sensory changes or weakness.  His range of motion is fairly preserved.  He does not sleep on his left side.  Occupation: Technically retired but still works intermittently, Marital status: Married, Substance use: None Diet: Typical American, low in fiber, high in protein, Exercise: No structured.  Currently taking care of a grandson, who is 327years old Last eye exam: Needs Last colonoscopy: Up-to-date Refills needed today: N/A Immunizations needed: Declines shingles vaccination Immunization History  Administered Date(s) Administered   Janssen (J&J) SARS-COV-2 Vaccination 01/14/2020     Past Medical History:  Diagnosis Date   Psoriasis    Social History   Socioeconomic History   Marital status: Married    Spouse name: Not on file   Number of children: Not on file   Years of education: Not on file   Highest education level: Not on file  Occupational History   Occupation: Retired  Tobacco Use   Smoking status: Never   Smokeless tobacco: Never  Vaping Use   Vaping Use: Never used  Substance and Sexual Activity   Alcohol use: No    Alcohol/week: 0.0 standard drinks   Drug use: No   Sexual activity: Not on file  Other Topics Concern   Not on file  Social History Narrative   Wife, LLattie Haw  He is retired from [place]rain: Not on fComcastInsecurity: Not on file  Transportation Needs: Not on file  Physical Activity: Not on file  Stress: Not on  file  Social Connections: Not on file  Intimate Partner Violence: Not on file   Past Surgical History:  Procedure Laterality Date   KNEE ARTHROSCOPY Right    History reviewed. No pertinent family history.  Current Outpatient Medications:    ibuprofen (ADVIL,MOTRIN) 200 MG tablet, Take 200 mg by mouth every 6 (six) hours as needed., Disp: , Rfl:    TREMFYA 100 MG/ML SOPN, Inject into the skin., Disp: , Rfl:   No Known Allergies   ROS: Review of Systems A comprehensive review of systems was negative except for: Eyes: positive for contacts/glasses Musculoskeletal: positive for arthralgias    Physical exam BP 140/90    Pulse 64    Temp (!) 97.2 F (36.2 C)    Ht 5' 10"  (1.778 m)    Wt 268 lb 9.6 oz (121.8 kg)    SpO2 96%    BMI 38.54 kg/m  General appearance: alert, cooperative, appears stated age, and moderately obese Head: Normocephalic, without obvious abnormality, atraumatic Eyes: negative findings: lids and lashes normal, conjunctivae and sclerae normal, corneas clear, and pupils equal, round, reactive to light and accomodation Ears: normal TM's and external ear canals both ears Nose: Nares normal. Septum midline. Mucosa normal. No drainage or sinus tenderness. Throat: lips, mucosa, and tongue normal; teeth and gums normal Neck: no adenopathy, no carotid bruit, supple, symmetrical, trachea midline, and thyroid not enlarged, symmetric, no tenderness/mass/nodules Back: symmetric, no  curvature. ROM normal. No CVA tenderness. Lungs: clear to auscultation bilaterally Chest wall: no tenderness Heart: regular rate and rhythm, S1, S2 normal, no murmur, click, rub or gallop Abdomen: soft, non-tender; bowel sounds normal; no masses,  no organomegaly; obese Extremities: extremities normal, atraumatic, no cyanosis or edema Pulses: 2+ and symmetric Skin: Skin color, texture, turgor normal. No rashes or lesions Lymph nodes: Cervical, supraclavicular, and axillary nodes normal. Neurologic:  Grossly normal Psych: Mood stable, speech normal, affect appropriate MSK:  Left shoulder: No gross deformity, soft tissue swelling or joint effusion.  No warmth.  Left shoulder, JOINT INJECTION:  Patient denies allergy to antiseptics (including iodine) and anesthetics.  Patient denies h/o diabetes, frequent steroid use, use of blood thinners/ antiplatelets.  Patient was given informed consent and a signed copy has been placed in the chart. Appropriate time out was taken. Area prepped and draped in usual sterile fashion. Anatomic landmarks were identified and injection site was marked.  Ethyl chloride spray was used to numb the area and 1 cc of methylprednisolone 40 mg/ml plus  3 cc of 1% lidocaine without epinephrine was injected into the left shoulder using a(n) posteriolateral approach. The patient tolerated the procedure well and there were no immediate complications. Estimated blood loss is less than 1 cc.  Post procedure instructions were reviewed and handout outlining these instructions were provided to patient.  Results for orders placed or performed in visit on 12/22/21 (from the past 72 hour(s))  Bayer DCA Hb A1c Waived     Status: None   Collection Time: 12/22/21  8:04 AM  Result Value Ref Range   HB A1C (BAYER DCA - WAIVED) 5.4 4.8 - 5.6 %    Comment:          Prediabetes: 5.7 - 6.4          Diabetes: >6.4          Glycemic control for adults with diabetes: <7.0   CBC     Status: None   Collection Time: 12/22/21  8:11 AM  Result Value Ref Range   WBC 3.9 3.4 - 10.8 x10E3/uL   RBC 5.21 4.14 - 5.80 x10E6/uL   Hemoglobin 15.8 13.0 - 17.7 g/dL   Hematocrit 45.6 37.5 - 51.0 %   MCV 88 79 - 97 fL   MCH 30.3 26.6 - 33.0 pg   MCHC 34.6 31.5 - 35.7 g/dL   RDW 12.8 11.6 - 15.4 %   Platelets 174 150 - 450 x10E3/uL  CMP14+EGFR     Status: Abnormal   Collection Time: 12/22/21  8:11 AM  Result Value Ref Range   Glucose 145 (H) 70 - 99 mg/dL   BUN 10 8 - 27 mg/dL   Creatinine, Ser  1.14 0.76 - 1.27 mg/dL   eGFR 72 >59 mL/min/1.73   BUN/Creatinine Ratio 9 (L) 10 - 24   Sodium 139 134 - 144 mmol/L   Potassium 4.3 3.5 - 5.2 mmol/L   Chloride 102 96 - 106 mmol/L   CO2 23 20 - 29 mmol/L   Calcium 10.1 8.6 - 10.2 mg/dL   Total Protein 7.1 6.0 - 8.5 g/dL   Albumin 4.3 3.8 - 4.8 g/dL   Globulin, Total 2.8 1.5 - 4.5 g/dL   Albumin/Globulin Ratio 1.5 1.2 - 2.2   Bilirubin Total 0.9 0.0 - 1.2 mg/dL   Alkaline Phosphatase 85 44 - 121 IU/L   AST 23 0 - 40 IU/L   ALT 20 0 - 44 IU/L  Lipid  panel     Status: None   Collection Time: 12/22/21  8:11 AM  Result Value Ref Range   Cholesterol, Total 158 100 - 199 mg/dL   Triglycerides 142 0 - 149 mg/dL   HDL 44 >39 mg/dL   VLDL Cholesterol Cal 25 5 - 40 mg/dL   LDL Chol Calc (NIH) 89 0 - 99 mg/dL   Chol/HDL Ratio 3.6 0.0 - 5.0 ratio    Comment:                                   T. Chol/HDL Ratio                                             Men  Women                               1/2 Avg.Risk  3.4    3.3                                   Avg.Risk  5.0    4.4                                2X Avg.Risk  9.6    7.1                                3X Avg.Risk 23.4   11.0   PSA     Status: None   Collection Time: 12/22/21  8:11 AM  Result Value Ref Range   Prostate Specific Ag, Serum 0.5 0.0 - 4.0 ng/mL    Comment: Roche ECLIA methodology. According to the American Urological Association, Serum PSA should decrease and remain at undetectable levels after radical prostatectomy. The AUA defines biochemical recurrence as an initial PSA value 0.2 ng/mL or greater followed by a subsequent confirmatory PSA value 0.2 ng/mL or greater. Values obtained with different assay methods or kits cannot be used interchangeably. Results cannot be interpreted as absolute evidence of the presence or absence of malignant disease.   T4, free     Status: None   Collection Time: 12/22/21  8:11 AM  Result Value Ref Range   Free T4 1.32 0.82 - 1.77  ng/dL  TSH     Status: None   Collection Time: 12/22/21  8:11 AM  Result Value Ref Range   TSH 2.600 0.450 - 4.500 uIU/mL     Assessment/ Plan: Lamont Dowdy here for annual physical exam.   Annual physical exam  Mixed hyperlipidemia  Morbid obesity ([place])  Elevated serum glucose  Chronic left shoulder pain  Cholesterol looks better than previous checkup.  ASCVD 11.5%.  Technically, cholesterol medication should be considered given his risk score but he is actively working to promote healthy weight and improve diet.  We will plan to recheck this and if persistently above 7.5%, need to strongly consider cholesterol medication  We discussed ways to improve diet, increase exercise.  Mediterranean diet provided  Sugar is elevated on the metabolic panel but his R5J was normal.  We need to monitor this closely.  Discussed carb restriction  Corticosteroid injection provided for the left shoulder.  He tolerated the shot well and there were no immediate complications.  We discussed that if this was not especially helpful I would like him to see our orthopedist that comes to the office once per month.  He will contact me if this is needed  Counseled on healthy lifestyle choices, including diet (rich in fruits, vegetables and lean meats and low in salt and simple carbohydrates) and exercise (at least 30 minutes of moderate physical activity daily).  Patient to follow up in 1 year for annual exam or sooner if needed.   M. Lajuana Ripple, DO

## 2022-04-07 DIAGNOSIS — L570 Actinic keratosis: Secondary | ICD-10-CM | POA: Diagnosis not present

## 2022-04-07 DIAGNOSIS — L400 Psoriasis vulgaris: Secondary | ICD-10-CM | POA: Diagnosis not present

## 2022-04-07 DIAGNOSIS — D485 Neoplasm of uncertain behavior of skin: Secondary | ICD-10-CM | POA: Diagnosis not present

## 2022-04-07 DIAGNOSIS — D0462 Carcinoma in situ of skin of left upper limb, including shoulder: Secondary | ICD-10-CM | POA: Diagnosis not present

## 2022-04-07 DIAGNOSIS — Z79899 Other long term (current) drug therapy: Secondary | ICD-10-CM | POA: Diagnosis not present

## 2022-04-29 DIAGNOSIS — Z79899 Other long term (current) drug therapy: Secondary | ICD-10-CM | POA: Diagnosis not present

## 2022-04-29 DIAGNOSIS — L400 Psoriasis vulgaris: Secondary | ICD-10-CM | POA: Diagnosis not present

## 2022-05-13 DIAGNOSIS — D0462 Carcinoma in situ of skin of left upper limb, including shoulder: Secondary | ICD-10-CM | POA: Diagnosis not present

## 2022-08-21 ENCOUNTER — Ambulatory Visit: Payer: BC Managed Care – PPO | Admitting: Family Medicine

## 2022-08-21 ENCOUNTER — Encounter: Payer: Self-pay | Admitting: Family Medicine

## 2022-08-21 VITALS — BP 128/81 | HR 78 | Temp 97.2°F | Ht 70.0 in | Wt 267.8 lb

## 2022-08-21 DIAGNOSIS — Z713 Dietary counseling and surveillance: Secondary | ICD-10-CM

## 2022-08-21 DIAGNOSIS — R7303 Prediabetes: Secondary | ICD-10-CM | POA: Diagnosis not present

## 2022-08-21 DIAGNOSIS — E782 Mixed hyperlipidemia: Secondary | ICD-10-CM | POA: Diagnosis not present

## 2022-08-21 NOTE — Progress Notes (Signed)
   Subjective: LO:VFIEPP obesity, pre-diabetes PCP: Janora Norlander, DO IRJ:JOACZY Wayne Munoz is a 66 y.o. male presenting to clinic today for:  1. PreDiabetes associated with morbid obesity He reports that he has had ups and downs with his weight for many years.  He has lost weight successfully with lifestyle modification but notes that he has not ever been sustainable with this.  Has decreased calories and increased exercise in the past.  He has never been medicated to aid with weight loss but is interested in this.  Last labs showed prediabetes.  Has known hyperlipidemia   ROS: Per HPI  No Known Allergies Past Medical History:  Diagnosis Date   Psoriasis     Current Outpatient Medications:    ibuprofen (ADVIL,MOTRIN) 200 MG tablet, Take 200 mg by mouth every 6 (six) hours as needed., Disp: , Rfl:    TREMFYA 100 MG/ML SOPN, Inject into the skin., Disp: , Rfl:  Social History   Socioeconomic History   Marital status: Married    Spouse name: Not on file   Number of children: Not on file   Years of education: Not on file   Highest education level: Not on file  Occupational History   Occupation: Retired  Tobacco Use   Smoking status: Never   Smokeless tobacco: Never  Vaping Use   Vaping Use: Never used  Substance and Sexual Activity   Alcohol use: No    Alcohol/week: 0.0 standard drinks of alcohol   Drug use: No   Sexual activity: Not on file  Other Topics Concern   Not on file  Social History Narrative   Wife, Lattie Haw   He is retired from [employer] Strain: Not on [employer] Insecurity: Not on file  Transportation Needs: Not on file  Physical Activity: Not on file  Stress: Not on file  Social Connections: Not on file  Intimate Partner Violence: Not on file   History reviewed. No pertinent family history.  Objective: Office vital signs reviewed. BP 128/81   Pulse 78   Temp (!) 97.2 F (36.2 C)   Ht 5'  10" (1.778 m)   Wt 267 lb 12.8 oz (121.5 kg)   SpO2 96%   BMI 38.43 kg/m   Physical Examination:  General: Awake, alert, morbidly obese, No acute distress Cardio: RRR Pulm: normal work of breathing on room air  Assessment/ Plan: 67 y.o. male   Weight loss counseling, encounter for  Morbid obesity ([place])  Prediabetes - Plan: Bayer DCA Hb A1c Waived  Mixed hyperlipidemia  Worksheet provided today for proper portion sizes, ways to reduce caloric intake and Mediterranean diet.  He is very interested in GLP and I think that [facility] would be a good option for him given his other comorbidities.  He is going to check with insurance to see if this is something that is covered and if so he will let me know which pharmacy to send it to.  No apparent contraindications to use  No orders of the defined types were placed in this encounter.  No orders of the defined types were placed in this encounter.    Janora Norlander, DO [place] [phone]

## 2022-10-19 ENCOUNTER — Ambulatory Visit: Payer: BC Managed Care – PPO | Admitting: Nurse Practitioner

## 2022-10-19 ENCOUNTER — Encounter: Payer: Self-pay | Admitting: Nurse Practitioner

## 2022-10-19 ENCOUNTER — Ambulatory Visit (INDEPENDENT_AMBULATORY_CARE_PROVIDER_SITE_OTHER): Payer: BC Managed Care – PPO

## 2022-10-19 VITALS — BP 147/79 | HR 74 | Temp 97.2°F | Resp 20 | Ht 70.0 in | Wt 267.0 lb

## 2022-10-19 DIAGNOSIS — R109 Unspecified abdominal pain: Secondary | ICD-10-CM

## 2022-10-19 DIAGNOSIS — M545 Low back pain, unspecified: Secondary | ICD-10-CM

## 2022-10-19 DIAGNOSIS — R1030 Lower abdominal pain, unspecified: Secondary | ICD-10-CM

## 2022-10-19 LAB — URINALYSIS, COMPLETE
Bilirubin, UA: NEGATIVE
Glucose, UA: NEGATIVE
Ketones, UA: NEGATIVE
Leukocytes,UA: NEGATIVE
Nitrite, UA: NEGATIVE
Protein,UA: NEGATIVE
RBC, UA: NEGATIVE
Specific Gravity, UA: 1.02 (ref 1.005–1.030)
Urobilinogen, Ur: 0.2 mg/dL (ref 0.2–1.0)
pH, UA: 7.5 (ref 5.0–7.5)

## 2022-10-19 LAB — MICROSCOPIC EXAMINATION
Bacteria, UA: NONE SEEN
Epithelial Cells (non renal): NONE SEEN /hpf (ref 0–10)
RBC, Urine: NONE SEEN /hpf (ref 0–2)
Renal Epithel, UA: NONE SEEN /hpf
WBC, UA: NONE SEEN /hpf (ref 0–5)

## 2022-10-19 MED ORDER — CYCLOBENZAPRINE HCL 5 MG PO TABS: 5.0000 mg | ORAL_TABLET | Freq: Three times a day (TID) | ORAL | 0 refills | Status: DC | PRN

## 2022-10-19 MED ORDER — NAPROXEN 500 MG PO TABS: 500.0000 mg | ORAL_TABLET | Freq: Two times a day (BID) | ORAL | 1 refills | Status: DC

## 2022-10-19 NOTE — Progress Notes (Signed)
Subjective:    Patient ID: Hakiem Malizia, male    DOB: [birth year], 66 y.o.   MRN: [id number]   Chief Complaint: Flank Pain   Flank Pain Pertinent negatives include no abdominal pain, chest pain, headaches or weakness.    Patient comes c/o right flank pain. Started 1 week ago. Rates lower back pain 4/10. Pain is intermittent. Was worse last night. He has had a kidney stone in the past.   Review of Systems  Constitutional:  Negative for diaphoresis.  Eyes:  Negative for pain.  Respiratory:  Negative for shortness of breath.   Cardiovascular:  Negative for chest pain, palpitations and leg swelling.  Gastrointestinal:  Negative for abdominal pain.  Endocrine: Negative for polydipsia.  Genitourinary:  Positive for flank pain.  Skin:  Negative for rash.  Neurological:  Negative for dizziness, weakness and headaches.  Hematological:  Does not bruise/bleed easily.  All other systems reviewed and are negative.      Objective:   Physical Exam Vitals and nursing note reviewed.  Constitutional:      Appearance: Normal appearance. He is well-developed.  HENT:     Head: Normocephalic.     Nose: Nose normal.     Mouth/Throat:     Mouth: Mucous membranes are moist.     Pharynx: Oropharynx is clear.  Eyes:     Pupils: Pupils are equal, round, and reactive to light.  Neck:     Thyroid: No thyroid mass or thyromegaly.     Vascular: No carotid bruit or JVD.     Trachea: Phonation normal.  Cardiovascular:     Rate and Rhythm: Normal rate and regular rhythm.  Pulmonary:     Effort: Pulmonary effort is normal. No respiratory distress.     Breath sounds: Normal breath sounds.  Abdominal:     General: Bowel sounds are normal.     Palpations: Abdomen is soft.     Tenderness: There is no abdominal tenderness. There is right CVA tenderness (mild).  Musculoskeletal:        General: Normal range of motion.     Cervical back: Normal range of motion and neck supple.     Comments:  FROMof lumbar spine with pain on flwxion and extension (-) SLr  Lymphadenopathy:     Cervical: No cervical adenopathy.  Skin:    General: Skin is warm and dry.  Neurological:     Mental Status: He is alert and oriented to person, place, and time.  Psychiatric:        Behavior: Behavior normal.        Thought Content: Thought content normal.        Judgment: Judgment normal.     BP (!) 147/79   Pulse 74   Temp (!) 97.2 F (36.2 C) (Temporal)   Resp 20   Ht 5\' 10"  (1.778 m)   Wt 267 lb (121.1 kg)   SpO2 95%   BMI 38.31 kg/m   Urine clear     Assessment & Plan:    Brendon Stolarz in today with chief complaint of Flank Pain   1. Inguinal pain, unspecified laterality - Urinalysis, Complete  2. Side pain - DG Abd 1 View  3. Acute right-sided low back pain without sciatica Moist heat Rest RTO prn  Meds ordered this encounter  Medications   naproxen (NAPROSYN) 500 MG tablet    Sig: Take 1 tablet (500 mg total) by mouth 2 (two) times daily with a meal.  Dispense:  60 tablet    Refill:  1    Order Specific Question:   Supervising Provider    Answer:   Arville Care A [[id number]]   cyclobenzaprine (FLEXERIL) 5 MG tablet    Sig: Take 1 tablet (5 mg total) by mouth 3 (three) times daily as needed for muscle spasms.    Dispense:  30 tablet    Refill:  0    Order Specific Question:   Supervising Provider    Answer:   Arville Care A [[id number]]        The above assessment and management plan was discussed with the patient. The patient verbalized understanding of and has agreed to the management plan. Patient is aware to call the clinic if symptoms persist or worsen. Patient is aware when to return to the clinic for a follow-up visit. Patient educated on when it is appropriate to go to the emergency department.   [name], FNP

## 2022-10-19 NOTE — Patient Instructions (Signed)
Acute Back Pain, Adult Acute back pain is sudden and usually short-lived. It is often caused by an injury to the muscles and tissues in the back. The injury may result from: A muscle, tendon, or ligament getting overstretched or torn. Ligaments are tissues that connect bones to each other. Lifting something improperly can cause a back strain. Wear and tear (degeneration) of the spinal disks. Spinal disks are circular tissue that provide cushioning between the bones of the spine (vertebrae). Twisting motions, such as while playing sports or doing yard work. A hit to the back. Arthritis. You may have a physical exam, lab tests, and imaging tests to find the cause of your pain. Acute back pain usually goes away with rest and home care. Follow these instructions at home: Managing pain, stiffness, and swelling Take over-the-counter and prescription medicines only as told by your health care provider. Treatment may include medicines for pain and inflammation that are taken by mouth or applied to the skin, or muscle relaxants. Your health care provider may recommend applying ice during the first 24-48 hours after your pain starts. To do this: Put ice in a plastic bag. Place a towel between your skin and the bag. Leave the ice on for 20 minutes, 2-3 times a day. Remove the ice if your skin turns bright red. This is very important. If you cannot feel pain, heat, or cold, you have a greater risk of damage to the area. If directed, apply heat to the affected area as often as told by your health care provider. Use the heat source that your health care provider recommends, such as a moist heat pack or a heating pad. Place a towel between your skin and the heat source. Leave the heat on for 20-30 minutes. Remove the heat if your skin turns bright red. This is especially important if you are unable to feel pain, heat, or cold. You have a greater risk of getting burned. Activity  Do not stay in bed. Staying in  bed for more than 1-2 days can delay your recovery. Sit up and stand up straight. Avoid leaning forward when you sit or hunching over when you stand. If you work at a desk, sit close to it so you do not need to lean over. Keep your chin tucked in. Keep your neck drawn back, and keep your elbows bent at a 90-degree angle (right angle). Sit high and close to the steering wheel when you drive. Add lower back (lumbar) support to your car seat, if needed. Take short walks on even surfaces as soon as you are able. Try to increase the length of time you walk each day. Do not sit, drive, or stand in one place for more than 30 minutes at a time. Sitting or standing for long periods of time can put stress on your back. Do not drive or use heavy machinery while taking prescription pain medicine. Use proper lifting techniques. When you bend and lift, use positions that put less stress on your back: Bend your knees. Keep the load close to your body. Avoid twisting. Exercise regularly as told by your health care provider. Exercising helps your back heal faster and helps prevent back injuries by keeping muscles strong and flexible. Work with a physical therapist to make a safe exercise program, as recommended by your health care provider. Do any exercises as told by your physical therapist. Lifestyle Maintain a healthy weight. Extra weight puts stress on your back and makes it difficult to have good   posture. Avoid activities or situations that make you feel anxious or stressed. Stress and anxiety increase muscle tension and can make back pain worse. Learn ways to manage anxiety and stress, such as through exercise. General instructions Sleep on a firm mattress in a comfortable position. Try lying on your side with your knees slightly bent. If you lie on your back, put a pillow under your knees. Keep your head and neck in a straight line with your spine (neutral position) when using electronic equipment like  smartphones or pads. To do this: Raise your smartphone or pad to look at it instead of bending your head or neck to look down. Put the smartphone or pad at the level of your face while looking at the screen. Follow your treatment plan as told by your health care provider. This may include: Cognitive or behavioral therapy. Acupuncture or massage therapy. Meditation or yoga. Contact a health care provider if: You have pain that is not relieved with rest or medicine. You have increasing pain going down into your legs or buttocks. Your pain does not improve after 2 weeks. You have pain at night. You lose weight without trying. You have a fever or chills. You develop nausea or vomiting. You develop abdominal pain. Get help right away if: You develop new bowel or bladder control problems. You have unusual weakness or numbness in your arms or legs. You feel faint. These symptoms may represent a serious problem that is an emergency. Do not wait to see if the symptoms will go away. Get medical help right away. Call your local emergency services (911 in the [place] Do not drive yourself to the hospital. Summary Acute back pain is sudden and usually short-lived. Use proper lifting techniques. When you bend and lift, use positions that put less stress on your back. Take over-the-counter and prescription medicines only as told by your health care provider, and apply heat or ice as told. This information is not intended to replace advice given to you by your health care provider. Make sure you discuss any questions you have with your health care provider. Document Revised: 01/10/2021 Document Reviewed: 01/10/2021 Elsevier Patient Education  2023 [employer] Inc[employer]  

## 2022-10-20 ENCOUNTER — Telehealth: Payer: Self-pay | Admitting: Family Medicine

## 2022-10-20 ENCOUNTER — Other Ambulatory Visit: Payer: Self-pay

## 2022-10-20 DIAGNOSIS — K8000 Calculus of gallbladder with acute cholecystitis without obstruction: Secondary | ICD-10-CM

## 2022-10-20 DIAGNOSIS — M545 Low back pain, unspecified: Secondary | ICD-10-CM

## 2022-10-20 NOTE — Addendum Note (Signed)
Addended by: Bennie Pierini on: 10/20/2022 03:52 PM   Modules accepted: Orders

## 2022-10-20 NOTE — Telephone Encounter (Signed)
I've already responded to this. Recommend proceeding with MM's recommendations with referral to gen surgery, as the gallbladder is likely the cause of symptoms.

## 2022-10-20 NOTE — Telephone Encounter (Signed)
Patient was in office 12/18 with acute back pain.  Xrays showed gallstones.  Can someone call and let them know what they need to do for this.  Is he to be referred out??  Please call.

## 2022-10-20 NOTE — Telephone Encounter (Signed)
Patient aware and verbalizes understanding.  States he would like to go ahead with referral to gen surgery

## 2022-10-21 ENCOUNTER — Encounter: Payer: Self-pay | Admitting: Nurse Practitioner

## 2022-10-21 ENCOUNTER — Telehealth: Payer: BC Managed Care – PPO | Admitting: Nurse Practitioner

## 2022-10-21 DIAGNOSIS — R051 Acute cough: Secondary | ICD-10-CM

## 2022-10-21 MED ORDER — PREDNISONE 20 MG PO TABS: 40.0000 mg | ORAL_TABLET | Freq: Every day | ORAL | 0 refills | Status: AC

## 2022-10-21 MED ORDER — PROMETHAZINE-DM 6.25-15 MG/5ML PO SYRP: 5.0000 mL | ORAL_SOLUTION | Freq: Four times a day (QID) | ORAL | 0 refills | Status: DC | PRN

## 2022-10-21 NOTE — Patient Instructions (Signed)

## 2022-10-21 NOTE — Progress Notes (Addendum)
Virtual Visit Consent   Wayne Munoz, you are scheduled for a virtual visit with [name], FNP, a [facility] provider, today.     Just as with appointments in the office, your consent must be obtained to participate.  Your consent will be active for this visit and any virtual visit you may have with one of our providers in the next 365 days.     If you have a MyChart account, a copy of this consent can be sent to you electronically.  All virtual visits are billed to your insurance company just like a traditional visit in the office.    As this is a virtual visit, video technology does not allow for your provider to perform a traditional examination.  This may limit your provider's ability to fully assess your condition.  If your provider identifies any concerns that need to be evaluated in person or the need to arrange testing (such as labs, EKG, etc.), we will make arrangements to do so.     Although advances in technology are sophisticated, we cannot ensure that it will always work on either your end or our end.  If the connection with a video visit is poor, the visit may have to be switched to a telephone visit.  With either a video or telephone visit, we are not always able to ensure that we have a secure connection.     I need to obtain your verbal consent now.   Are you willing to proceed with your visit today? YES   Kaid Smethurst has provided verbal consent on 10/21/2022 for a virtual visit (video or telephone).  Had video connection but was not ableto hear- had to call patient [name], FNP   Date: 10/21/2022 12:47 PM   Virtual Visit via Video Note   I, Mary-Margaret , connected with Wayne Munoz ([id number], 03/10/1956) on 10/21/22 at  2:00 PM EST by a video-enabled telemedicine application and verified that I am speaking with the correct person using two identifiers.  Location: Patient: Virtual Visit Location Patient: Home Provider:  Virtual Visit Location Provider: Mobile   I discussed the limitations of evaluation and management by telemedicine and the availability of in person appointments. The patient expressed understanding and agreed to proceed.    History of Present Illness: [name] is a 66 y.o. who identifies as a male who was assigned male at birth, and is being seen today for cough and sore throat.  HPI: Cough This is a new problem. The problem has been waxing and waning. The problem occurs every few minutes. The cough is Non-productive. Associated symptoms include nasal congestion and rhinorrhea. Pertinent negatives include no chills, ear congestion or fever (feel hot right now). Nothing aggravates the symptoms. He has tried nothing for the symptoms. The treatment provided mild relief.    Review of Systems  Constitutional:  Negative for chills and fever (feel hot right now).  HENT:  Positive for rhinorrhea.   Respiratory:  Positive for cough.     Problems:  Patient Active Problem List   Diagnosis Date Noted   Chronic left shoulder pain 12/24/2021   Elevated serum glucose 12/24/2021   Morbid obesity (HCC) 12/24/2021   Mixed hyperlipidemia 12/24/2021    Allergies: No Known Allergies Medications:  Current Outpatient Medications:    cyclobenzaprine (FLEXERIL) 5 MG tablet, Take 1 tablet (5 mg total) by mouth 3 (three) times daily as needed for muscle spasms., Disp: 30 tablet, Rfl: 0   ibuprofen (ADVIL,MOTRIN) 200  MG tablet, Take 200 mg by mouth every 6 (six) hours as needed., Disp: , Rfl:    naproxen (NAPROSYN) 500 MG tablet, Take 1 tablet (500 mg total) by mouth 2 (two) times daily with a meal., Disp: 60 tablet, Rfl: 1   TREMFYA 100 MG/ML SOPN, Inject into the skin., Disp: , Rfl:   Observations/Objective: Patient is well-developed, well-nourished in no acute distress.  Resting comfortably  at home.  Head is normocephalic, atraumatic.  No labored breathing.  Speech is clear and coherent with  logical content.  Patient is alert and oriented at baseline.  Raspy voice Deep dry cough  Assessment and Plan:  Refujio Abts in today with chief complaint of Cough   1. Acute cough 1. Take meds as prescribed 2. Use a cool mist humidifier especially during the winter months and when heat has been humid. 3. Use saline nose sprays frequently 4. Saline irrigations of the nose can be very helpful if done frequently.  * 4X daily for 1 week*  * Use of a nettie pot can be helpful with this. Follow directions with this* 5. Drink plenty of fluids 6. Keep thermostat turn down low 7.For any cough or congestion- promethazine DM 8. For fever or aces or pains- take tylenol or ibuprofen appropriate for age and weight.  * for fevers greater than 101 orally you may alternate ibuprofen and tylenol every  3 hours.   Meds ordered this encounter  Medications   predniSONE (DELTASONE) 20 MG tablet    Sig: Take 2 tablets (40 mg total) by mouth daily with breakfast for 5 days. 2 po daily for 5 days    Dispense:  10 tablet    Refill:  0    Order Specific Question:   Supervising Provider    Answer:   Arville Care A [[id number]]   promethazine-dextromethorphan (PROMETHAZINE-DM) 6.25-15 MG/5ML syrup    Sig: Take 5 mLs by mouth 4 (four) times daily as needed for cough.    Dispense:  118 mL    Refill:  0    Order Specific Question:   Supervising Provider    Answer:   Arville Care A [[id number]]      Follow Up Instructions: I discussed the assessment and treatment plan with the patient. The patient was provided an opportunity to ask questions and all were answered. The patient agreed with the plan and demonstrated an understanding of the instructions.  A copy of instructions were sent to the patient via MyChart.  The patient was advised to call back or seek an in-person evaluation if the symptoms worsen or if the condition fails to improve as anticipated.  Time:  I spent 7 minutes with the  patient via telehealth technology discussing the above problems/concerns.    [name], FNP

## 2022-10-27 ENCOUNTER — Ambulatory Visit: Payer: BC Managed Care – PPO | Admitting: Nurse Practitioner

## 2022-10-27 ENCOUNTER — Encounter: Payer: Self-pay | Admitting: Nurse Practitioner

## 2022-10-27 VITALS — BP 153/77 | HR 65 | Temp 97.9°F | Resp 20 | Ht 70.0 in | Wt 271.0 lb

## 2022-10-27 DIAGNOSIS — J40 Bronchitis, not specified as acute or chronic: Secondary | ICD-10-CM

## 2022-10-27 MED ORDER — PREDNISONE 20 MG PO TABS: 40.0000 mg | ORAL_TABLET | Freq: Every day | ORAL | 0 refills | Status: AC

## 2022-10-27 MED ORDER — AZITHROMYCIN 250 MG PO TABS: ORAL_TABLET | ORAL | 0 refills | Status: DC

## 2022-10-27 NOTE — Patient Instructions (Signed)

## 2022-10-27 NOTE — Progress Notes (Signed)
Subjective:    Patient ID: Wayne Munoz, male    DOB: 09-29-1956, 66 y.o.   MRN: [id number]   Chief Complaint: Nasal Congestion and Wheezing   Patient was seen on 10/21/22 with cough. He was dx with bronchitis and was given prednisone and cough meds. Says he is no better.   Wheezing  This is a new problem. The current episode started in the past 7 days. The problem occurs intermittently. The problem has been waxing and waning. Associated symptoms include sputum production. Pertinent negatives include no abdominal pain, chest pain, chills, headaches, rash or shortness of breath. Nothing aggravates the symptoms. He has tried prescription cough suppressant and oral steroids for the symptoms. The treatment provided mild relief.       Review of Systems  Constitutional:  Negative for chills and diaphoresis.  Eyes:  Negative for pain.  Respiratory:  Positive for sputum production and wheezing. Negative for shortness of breath.   Cardiovascular:  Negative for chest pain, palpitations and leg swelling.  Gastrointestinal:  Negative for abdominal pain.  Endocrine: Negative for polydipsia.  Skin:  Negative for rash.  Neurological:  Negative for dizziness, weakness and headaches.  Hematological:  Does not bruise/bleed easily.  All other systems reviewed and are negative.      Objective:   Physical Exam Constitutional:      Appearance: Normal appearance.  HENT:     Right Ear: Tympanic membrane normal.     Left Ear: Tympanic membrane normal.     Nose: Congestion and rhinorrhea present.     Mouth/Throat:     Mouth: Mucous membranes are moist.     Pharynx: No oropharyngeal exudate or posterior oropharyngeal erythema.  Cardiovascular:     Rate and Rhythm: Normal rate and regular rhythm.     Heart sounds: Normal heart sounds.  Pulmonary:     Breath sounds: Wheezing (exp wheezes) present.  Musculoskeletal:     Cervical back: Normal range of motion and neck supple.  Skin:     General: Skin is warm.  Neurological:     General: No focal deficit present.     Mental Status: He is alert and oriented to person, place, and time.  Psychiatric:        Mood and Affect: Mood normal.        Behavior: Behavior normal.     BP (!) 153/77   Pulse 65   Temp 97.9 F (36.6 C) (Temporal)   Resp 20   Ht 5\' 10"  (1.778 m)   Wt 271 lb (122.9 kg)   SpO2 100%   BMI 38.88 kg/m        Assessment & Plan:   Wayne Munoz comes in today with chief complaint of Nasal Congestion and Wheezing   Diagnosis and orders addressed:  1. Bronchitis 1. Take meds as prescribed 2. Use a cool mist humidifier especially during the winter months and when heat has been humid. 3. Use saline nose sprays frequently 4. Saline irrigations of the nose can be very helpful if done frequently.  * 4X daily for 1 week*  * Use of a nettie pot can be helpful with this. Follow directions with this* 5. Drink plenty of fluids 6. Keep thermostat turn down low 7.For any cough or congestion- mucinex 8. For fever or aces or pains- take tylenol or ibuprofen appropriate for age and weight.  * for fevers greater than 101 orally you may alternate ibuprofen and tylenol every  3 hours.    -  predniSONE (DELTASONE) 20 MG tablet; Take 2 tablets (40 mg total) by mouth daily with breakfast for 5 days. 2 po daily for 5 days  Dispense: 10 tablet; Refill: 0 - azithromycin (ZITHROMAX Z-PAK) 250 MG tablet; As directed  Dispense: 6 tablet; Refill: 0   Follow up plan: prn   [name], FNP

## 2022-10-30 ENCOUNTER — Other Ambulatory Visit: Payer: Self-pay | Admitting: *Deleted

## 2022-10-30 DIAGNOSIS — K8020 Calculus of gallbladder without cholecystitis without obstruction: Secondary | ICD-10-CM

## 2022-11-05 ENCOUNTER — Encounter: Payer: Self-pay | Admitting: General Surgery

## 2022-11-05 ENCOUNTER — Ambulatory Visit: Payer: BC Managed Care – PPO | Admitting: General Surgery

## 2022-11-05 VITALS — BP 180/95 | HR 64 | Temp 98.0°F | Resp 14 | Ht 70.0 in | Wt 272.0 lb

## 2022-11-05 DIAGNOSIS — K8020 Calculus of gallbladder without cholecystitis without obstruction: Secondary | ICD-10-CM | POA: Diagnosis not present

## 2022-11-05 NOTE — Progress Notes (Signed)
Wayne Munoz; [id number]; May 31, 1956   HPI Patient is a 67 year old white male who was referred to my care by Dr. Lajuana Ripple for evaluation and treatment of biliary colic secondary to cholelithiasis.  Approximately 4 weeks ago, the patient started having episode of right flank pain and lower pelvic pain.  There was initially thought that he had a kidney stone but his urinalysis was negative.  He had a abdominal film which showed 2 gallstones in the right upper quadrant.  Patient states the episode has since resolved.  He denied any fever, chills, or jaundice.  He does state that this may have been associated with food, though he has had no specific episodes of fatty food intolerance.  He is trying to avoid greasy foods in general.  He currently feels fine other than some right upper back pain.  This appears to be his first episode. Past Medical History:  Diagnosis Date   Psoriasis     Past Surgical History:  Procedure Laterality Date   KNEE ARTHROSCOPY Right     History reviewed. No pertinent family history.  Current Outpatient Medications on File Prior to Visit  Medication Sig Dispense Refill   TREMFYA 100 MG/ML SOPN Inject into the skin.     cyclobenzaprine (FLEXERIL) 5 MG tablet Take 1 tablet (5 mg total) by mouth 3 (three) times daily as needed for muscle spasms. (Patient not taking: Reported on 11/05/2022) 30 tablet 0   naproxen (NAPROSYN) 500 MG tablet Take 1 tablet (500 mg total) by mouth 2 (two) times daily with a meal. (Patient not taking: Reported on 11/05/2022) 60 tablet 1   No current facility-administered medications on file prior to visit.    No Known Allergies  Social History   Substance and Sexual Activity  Alcohol Use No   Alcohol/week: 0.0 standard drinks of alcohol    Social History   Tobacco Use  Smoking Status Never  Smokeless Tobacco Never    Review of Systems  Constitutional: Negative.   HENT: Negative.    Eyes: Negative.   Respiratory: Negative.     Cardiovascular: Negative.   Gastrointestinal:  Positive for abdominal pain.  Genitourinary: Negative.   Musculoskeletal:  Positive for back pain and joint pain.  Skin: Negative.   Neurological: Negative.   Endo/Heme/Allergies: Negative.   Psychiatric/Behavioral: Negative.      Objective   Vitals:   11/05/22 0922  BP: (!) 180/95  Pulse: 64  Resp: 14  Temp: 98 F (36.7 C)  SpO2: 94%    Physical Exam Vitals reviewed.  Constitutional:      Appearance: Normal appearance. He is obese. He is not ill-appearing.  HENT:     Head: Normocephalic and atraumatic.  Eyes:     General: No scleral icterus. Cardiovascular:     Rate and Rhythm: Normal rate and regular rhythm.     Heart sounds: Normal heart sounds. No murmur heard.    No friction rub. No gallop.  Pulmonary:     Effort: Pulmonary effort is normal. No respiratory distress.     Breath sounds: Normal breath sounds. No stridor. No wheezing, rhonchi or rales.  Abdominal:     General: Bowel sounds are normal. There is no distension.     Palpations: Abdomen is soft. There is no mass.     Tenderness: There is no abdominal tenderness. There is no guarding or rebound.     Hernia: No hernia is present.  Skin:    General: Skin is warm and dry.  Neurological:  Mental Status: He is alert and oriented to person, place, and time.    Primary care notes reviewed Assessment  Cholelithiasis with an episode of biliary colic, currently asymptomatic Plan  At this point, the patient is asymptomatic.  We did discuss his episode of biliary colic.  I told him that there was a chance in the future that he would have another episode.  Should he have another episode, he was instructed to call my office and then we did proceed with surgical intervention.  He does not need acute surgical invention at the present time and he is fine with that.  Literature was given concerning cholelithiasis.  Follow-up here as needed.

## 2022-11-19 ENCOUNTER — Telehealth (INDEPENDENT_AMBULATORY_CARE_PROVIDER_SITE_OTHER): Payer: BC Managed Care – PPO | Admitting: Family Medicine

## 2022-11-19 ENCOUNTER — Encounter: Payer: Self-pay | Admitting: Family Medicine

## 2022-11-19 DIAGNOSIS — R102 Pelvic and perineal pain: Secondary | ICD-10-CM

## 2022-11-19 DIAGNOSIS — R3915 Urgency of urination: Secondary | ICD-10-CM

## 2022-11-19 NOTE — Progress Notes (Addendum)
Telephone visit  Subjective: Wayne pain PCP: Janora Munoz, Wayne Munoz is a 67 y.o. male calls for telephone consult today. Patient provides verbal consent for consult held via phone.  Due to COVID-19 pandemic this visit was conducted virtually. This visit type was conducted due to national recommendations for restrictions regarding the COVID-19 Pandemic (e.g. social distancing, sheltering in place) in an effort to limit this patient's exposure and mitigate transmission in our community. All issues noted in this document were discussed and addressed.  A physical exam was not performed with this format.   Location of patient: home Location of provider: [facility] Others present for call: none  1. Groin pain/ pressure Pressure in groin near rectum got minimally better after a large bowel movement.  He reports urinary frequency and urgency.  Was evaluated about 1 month ago and had normal UA.  KUB showed gallstones but no renal stones.  No hematuria. No fevers, nausea or vomiting.  No flank pain but does have RUQ pain that radiates to back.  This is believed to be related to gallstone.  He notes that the groin pain/ pressure he experiences is about 3 fingerbreaths above his penis.  Denies any groin masses.    ROS: Per HPI  No Known Allergies Past Medical History:  Diagnosis Date   Psoriasis     Current Outpatient Medications:    cyclobenzaprine (FLEXERIL) 5 MG tablet, Take 1 tablet (5 mg total) by mouth 3 (three) times daily as needed for muscle spasms. (Patient not taking: Reported on 11/05/2022), Disp: 30 tablet, Rfl: 0   naproxen (NAPROSYN) 500 MG tablet, Take 1 tablet (500 mg total) by mouth 2 (two) times daily with a meal. (Patient not taking: Reported on 11/05/2022), Disp: 60 tablet, Rfl: 1   TREMFYA 100 MG/ML SOPN, Inject into the skin., Disp: , Rfl:   Recent Results (from the past 2160 hour(s))  Urinalysis, Complete     Status: None   Collection Time: 10/19/22 12:53  PM  Result Value Ref Range   Specific Gravity, UA 1.020 1.005 - 1.030   pH, UA 7.5 5.0 - 7.5   Color, UA Yellow Yellow   Appearance Ur Clear Clear   Leukocytes,UA Negative Negative   Protein,UA Negative Negative/Trace   Glucose, UA Negative Negative   Ketones, UA Negative Negative   RBC, UA Negative Negative   Bilirubin, UA Negative Negative   Urobilinogen, Ur 0.2 0.2 - 1.0 mg/dL   Nitrite, UA Negative Negative   Microscopic Examination See below:   Microscopic Examination     Status: None   Collection Time: 10/19/22 12:53 PM   Urine  Result Value Ref Range   WBC, UA None seen 0 - 5 /hpf   RBC, Urine None seen 0 - 2 /hpf   Epithelial Cells (non renal) None seen 0 - 10 /hpf   Renal Epithel, UA None seen None seen /hpf   Bacteria, UA None seen None seen/Few   Assessment/ Plan: 67 y.o. male   Urinary urgency - Plan: Ambulatory referral to Urology, Urinalysis, Routine w reflex microscopic, Urine Culture  Pelvic pain - Plan: Ambulatory referral to Urology, Urinalysis, Routine w reflex microscopic, Urine Culture  ?retained bladder stone vs BPH complicated by constipation?  Not having true flank pain so CTA renal was not ordered.  I reviewed last in office note and recent gen surgery note.  Referral to urology placed.  Hopefully, can offer cystoscopy and prostate exam.  Of note, PSA in 12/2021 was normal.  Repeat UA and UCx  Start time: 2:18pm End time: 2:29pm  Total time spent on patient care (including telephone call/ virtual visit): 11 minutes  [place], [place] [phone]

## 2022-11-19 NOTE — Addendum Note (Signed)
Addended by: Janora Norlander on: 11/19/2022 02:36 PM   Modules accepted: Orders

## 2022-11-20 ENCOUNTER — Other Ambulatory Visit: Payer: BC Managed Care – PPO

## 2022-11-20 DIAGNOSIS — R102 Pelvic and perineal pain: Secondary | ICD-10-CM

## 2022-11-20 DIAGNOSIS — R3915 Urgency of urination: Secondary | ICD-10-CM | POA: Diagnosis not present

## 2022-11-20 LAB — URINALYSIS, ROUTINE W REFLEX MICROSCOPIC
Bilirubin, UA: NEGATIVE
Glucose, UA: NEGATIVE
Ketones, UA: NEGATIVE
Leukocytes,UA: NEGATIVE
Nitrite, UA: NEGATIVE
Protein,UA: NEGATIVE
RBC, UA: NEGATIVE
Specific Gravity, UA: 1.02 (ref 1.005–1.030)
Urobilinogen, Ur: 0.2 mg/dL (ref 0.2–1.0)
pH, UA: 6.5 (ref 5.0–7.5)

## 2022-11-22 LAB — URINE CULTURE: Organism ID, Bacteria: NO GROWTH

## 2022-11-25 ENCOUNTER — Telehealth: Payer: Self-pay | Admitting: Family Medicine

## 2022-11-25 ENCOUNTER — Encounter: Payer: Self-pay | Admitting: Family Medicine

## 2022-11-25 ENCOUNTER — Other Ambulatory Visit: Payer: Self-pay | Admitting: Family Medicine

## 2022-11-25 DIAGNOSIS — R3915 Urgency of urination: Secondary | ICD-10-CM

## 2022-11-25 DIAGNOSIS — E782 Mixed hyperlipidemia: Secondary | ICD-10-CM

## 2022-11-25 DIAGNOSIS — R7303 Prediabetes: Secondary | ICD-10-CM

## 2022-11-25 NOTE — Telephone Encounter (Signed)
Sent via [facility], my husband came down Friday and redid his urine test, since he is still occasionally having pressure in his lower area and thought maybe it was a urinary track infection, but they called and that test was clear. This has been going on since late Dec and he's had multiple visits. We have made an appointment with the urologist as suggested, but he wondered if it might be his prostate and if he could have blood work or some test that would show if that's what's going on. Please follow up with [facility], his cell # is [phone]. Thank you so much, we just want to be sure there's nothing serious going on. Wayne Munoz

## 2022-11-25 NOTE — Progress Notes (Signed)
I spoke to patient on phone today.  I reiterated that his prostate antigen was normal when he was checked in February.  I am very glad to obtain labs sooner than his normal physical as he is not able to secure physical until this summer.  Of course if there is anything that is alarming on those labs we will get him more urgently into urology.  It sounds like may be some of his symptoms are really more related to constipation.  Regulation of those bowel movements will be essential no fluid will resolve his issue.  I did direct him to his office visit notes as most of these notes will summarize recommendations and red flag signs and symptoms warranting return  Orders Placed This Encounter  Procedures   PSA    Standing Status:   Future    Standing Expiration Date:   11/26/2023   Lipid panel    Standing Status:   Future    Standing Expiration Date:   11/26/2023   CMP14+EGFR    Standing Status:   Future    Standing Expiration Date:   11/26/2023   CBC    Standing Status:   Future    Standing Expiration Date:   11/26/2023   TSH    Standing Status:   Future    Standing Expiration Date:   11/26/2023   Bayer DCA Hb A1c Waived    Standing Status:   Future    Standing Expiration Date:   11/26/2023

## 2022-11-26 ENCOUNTER — Other Ambulatory Visit: Payer: BC Managed Care – PPO

## 2022-11-26 DIAGNOSIS — R7303 Prediabetes: Secondary | ICD-10-CM | POA: Diagnosis not present

## 2022-11-26 DIAGNOSIS — E782 Mixed hyperlipidemia: Secondary | ICD-10-CM | POA: Diagnosis not present

## 2022-11-26 DIAGNOSIS — R3915 Urgency of urination: Secondary | ICD-10-CM

## 2022-11-26 LAB — BAYER DCA HB A1C WAIVED: HB A1C (BAYER DCA - WAIVED): 6.4 % — ABNORMAL HIGH (ref 4.8–5.6)

## 2022-11-27 LAB — CBC
Hematocrit: 43.6 % (ref 37.5–51.0)
Hemoglobin: 14.6 g/dL (ref 13.0–17.7)
MCH: 29.2 pg (ref 26.6–33.0)
MCHC: 33.5 g/dL (ref 31.5–35.7)
MCV: 87 fL (ref 79–97)
Platelets: 190 10*3/uL (ref 150–450)
RBC: 5 x10E6/uL (ref 4.14–5.80)
RDW: 13.2 % (ref 11.6–15.4)
WBC: 4.4 10*3/uL (ref 3.4–10.8)

## 2022-11-27 LAB — CMP14+EGFR
ALT: 18 IU/L (ref 0–44)
AST: 20 IU/L (ref 0–40)
Albumin/Globulin Ratio: 1.6 (ref 1.2–2.2)
Albumin: 4.3 g/dL (ref 3.9–4.9)
Alkaline Phosphatase: 79 IU/L (ref 44–121)
BUN/Creatinine Ratio: 7 — ABNORMAL LOW (ref 10–24)
BUN: 8 mg/dL (ref 8–27)
Bilirubin Total: 1 mg/dL (ref 0.0–1.2)
CO2: 22 mmol/L (ref 20–29)
Calcium: 9.4 mg/dL (ref 8.6–10.2)
Chloride: 102 mmol/L (ref 96–106)
Creatinine, Ser: 1.13 mg/dL (ref 0.76–1.27)
Globulin, Total: 2.7 g/dL (ref 1.5–4.5)
Glucose: 140 mg/dL — ABNORMAL HIGH (ref 70–99)
Potassium: 4.5 mmol/L (ref 3.5–5.2)
Sodium: 141 mmol/L (ref 134–144)
Total Protein: 7 g/dL (ref 6.0–8.5)
eGFR: 73 mL/min/{1.73_m2} (ref >59)

## 2022-11-27 LAB — PSA: Prostate Specific Ag, Serum: 0.4 ng/mL (ref 0.0–4.0)

## 2022-11-27 LAB — LIPID PANEL
Chol/HDL Ratio: 3.6 ratio (ref 0.0–5.0)
Cholesterol, Total: 144 mg/dL (ref 100–199)
HDL: 40 mg/dL (ref >39)
LDL Chol Calc (NIH): 80 mg/dL (ref 0–99)
Triglycerides: 137 mg/dL (ref 0–149)
VLDL Cholesterol Cal: 24 mg/dL (ref 5–40)

## 2022-11-27 LAB — TSH: TSH: 4 u[IU]/mL (ref 0.450–4.500)

## 2022-12-08 ENCOUNTER — Encounter: Payer: Self-pay | Admitting: Urology

## 2022-12-08 ENCOUNTER — Ambulatory Visit: Payer: BC Managed Care – PPO | Admitting: Urology

## 2022-12-08 VITALS — BP 176/101 | HR 80 | Ht 72.0 in | Wt 260.0 lb

## 2022-12-08 DIAGNOSIS — R399 Unspecified symptoms and signs involving the genitourinary system: Secondary | ICD-10-CM | POA: Diagnosis not present

## 2022-12-08 DIAGNOSIS — R350 Frequency of micturition: Secondary | ICD-10-CM | POA: Diagnosis not present

## 2022-12-08 DIAGNOSIS — R3914 Feeling of incomplete bladder emptying: Secondary | ICD-10-CM

## 2022-12-08 LAB — URINALYSIS
Glucose, UA: NEGATIVE mg/dL
Ketones, POC UA: NEGATIVE mg/dL
Leukocytes, UA: NEGATIVE
Nitrite, UA: NEGATIVE
Protein Ur, POC: NEGATIVE mg/dL
Spec Grav, UA: 1.025 (ref 1.010–1.025)
Urobilinogen, UA: 0.2 E.U./dL
pH, UA: 6.5 (ref 5.0–8.0)

## 2022-12-08 MED ORDER — TAMSULOSIN HCL 0.4 MG PO CAPS: 0.4000 mg | ORAL_CAPSULE | Freq: Every day | ORAL | 3 refills | Status: DC

## 2022-12-08 NOTE — Progress Notes (Signed)
   Assessment: 1. Lower urinary tract symptoms (LUTS)      Plan: Today I had a long discussion with the patient that his lower urinary tract symptoms and options for further evaluation and treatment.  He elects to begin medical therapy. Rx: Tamsulosin 0.4 mg daily Patient medication proper administration and side effects reviewed. Patient will follow-up in 3 months for symptom assessment and residual urine determination.  Chief Complaint: LUTS  History of Present Illness:  Wayne Munoz is a 67 y.o. male who is seen in consultation from [place], DO for evaluation of LUTS.  Patient reports that over the past few months and especially the last 2 days noted significant lower urinary tract symptoms consisting predominantly of urinary frequency and feeling of incomplete emptying.  Current IPSS = 12.  Patient also reports suprapubic pressure that tends to go away with voiding.  Patient recently underwent evaluation including KUB that showed no evidence of overlying renal calcifications.  He was found to have gallstones.  Urinalyses have been completely normal including today.  No definite h/o prior kidney stone.    PSA 11/2022 = 0.4 No family history of prostate disease.   Past Medical History:  Past Medical History:  Diagnosis Date   Psoriasis     Past Surgical History:  Past Surgical History:  Procedure Laterality Date   KNEE ARTHROSCOPY Right     Allergies:  No Known Allergies  Family History:  History reviewed. No pertinent family history.  Social History:  Social History   Tobacco Use   Smoking status: Never   Smokeless tobacco: Never  Vaping Use   Vaping Use: Never used  Substance Use Topics   Alcohol use: No    Alcohol/week: 0.0 standard drinks of alcohol   Drug use: No    Review of symptoms:  Constitutional:  Negative for unexplained weight loss, night sweats, fever, chills ENT:  Negative for nose bleeds, sinus pain, painful swallowing CV:   Negative for chest pain, shortness of breath, exercise intolerance, palpitations, loss of consciousness Resp:  Negative for cough, wheezing, shortness of breath GI:  Negative for nausea, vomiting, diarrhea, bloody stools GU:  Positives noted in HPI; otherwise negative for gross hematuria, dysuria, urinary incontinence Neuro:  Negative for seizures, poor balance, limb weakness, slurred speech Psych:  Negative for lack of energy, depression, anxiety Endocrine:  Negative for polydipsia, polyuria, symptoms of hypoglycemia (dizziness, hunger, sweating) Hematologic:  Negative for anemia, purpura, petechia, prolonged or excessive bleeding, use of anticoagulants  Allergic:  Negative for difficulty breathing or choking as a result of exposure to anything; no shellfish allergy; no allergic response (rash/itch) to materials, foods  Physical exam: BP (!) 176/101   Pulse 80   Ht 6' (1.829 m)   Wt 260 lb (117.9 kg)   BMI 35.26 kg/m  GENERAL APPEARANCE:  Well appearing, well developed, well nourished, NAD HEENT: Atraumatic, Normocephalic, oropharynx clear. NECK: Supple without lymphadenopathy or thyromegaly. LUNGS: Clear to auscultation bilaterally. HEART: Regular Rate and Rhythm without murmurs, gallops, or rubs. ABDOMEN: Soft, non-tender, No Masses.  GU: Normal phallus testes and cords.  No hernia DRE: Normal sphincter tone; prostate is approximately 30 g without evidence of nodules or induration.  Nontender exam.  BACK:  Non-tender to palpation.  No CVAT SKIN:  Warm, dry and intact.    Results: No results found for this or any previous visit (from the past 24 hour(s)).

## 2022-12-24 ENCOUNTER — Telehealth: Payer: Self-pay | Admitting: Urology

## 2022-12-24 NOTE — Telephone Encounter (Signed)
Spoke with patient, read him Dr. Juel Burrow plan from his OV. Patient states that he is feeling much better after being on the meds for a couple weeks.

## 2022-12-24 NOTE — Telephone Encounter (Signed)
Patient's wife called a& requested a call to the patient regarding his last visit on 12-08-2022. Patient doesn't remember what was discussed during visit & is confused. Patient would like clarification. The best # [phone]. - lmr.

## 2023-01-01 ENCOUNTER — Ambulatory Visit: Payer: BC Managed Care – PPO | Admitting: Urology

## 2023-02-25 ENCOUNTER — Telehealth: Payer: Self-pay | Admitting: *Deleted

## 2023-02-25 NOTE — Telephone Encounter (Addendum)
Received call from patient spouse, Misty Stanley [phone] telephone.   Patient seen in office on 11/05/2022 for cholelithiasis and biliary colic. No acute surgical intervention was required at that time.  Patient reports that he has had x2 episodes of RUQ pain and nausea due to cholelithiasis. Patient would like to proceed with surgical intervention.   Appointment scheduled for 03/02/2023.

## 2023-03-01 ENCOUNTER — Other Ambulatory Visit: Payer: Self-pay

## 2023-03-01 ENCOUNTER — Telehealth: Payer: Self-pay | Admitting: Family Medicine

## 2023-03-01 NOTE — Telephone Encounter (Signed)
Patient needs lab orders added for appt on 5/8. Please call back to make lab appt.

## 2023-03-01 NOTE — Telephone Encounter (Signed)
Wife calling back to check on status of orders being added. Made appt for 5/1 for patient to come in for labs. Please add orders. No need to call back when added.

## 2023-03-02 ENCOUNTER — Ambulatory Visit: Payer: BC Managed Care – PPO | Admitting: General Surgery

## 2023-03-02 ENCOUNTER — Other Ambulatory Visit: Payer: Self-pay

## 2023-03-02 ENCOUNTER — Encounter: Payer: Self-pay | Admitting: General Surgery

## 2023-03-02 VITALS — BP 157/87 | HR 71 | Temp 97.9°F | Resp 14 | Ht 72.0 in | Wt 267.0 lb

## 2023-03-02 DIAGNOSIS — K8020 Calculus of gallbladder without cholecystitis without obstruction: Secondary | ICD-10-CM

## 2023-03-02 NOTE — Telephone Encounter (Signed)
Labs have been added

## 2023-03-03 ENCOUNTER — Other Ambulatory Visit: Payer: BC Managed Care – PPO

## 2023-03-03 ENCOUNTER — Other Ambulatory Visit: Payer: Self-pay

## 2023-03-03 DIAGNOSIS — E782 Mixed hyperlipidemia: Secondary | ICD-10-CM | POA: Diagnosis not present

## 2023-03-03 DIAGNOSIS — R739 Hyperglycemia, unspecified: Secondary | ICD-10-CM | POA: Diagnosis not present

## 2023-03-03 LAB — CMP14+EGFR
ALT: 19 IU/L (ref 0–44)
AST: 20 IU/L (ref 0–40)
Albumin/Globulin Ratio: 1.5 (ref 1.2–2.2)
Albumin: 4.1 g/dL (ref 3.9–4.9)
Alkaline Phosphatase: 71 IU/L (ref 44–121)
BUN/Creatinine Ratio: 11 (ref 10–24)
BUN: 12 mg/dL (ref 8–27)
Bilirubin Total: 0.9 mg/dL (ref 0.0–1.2)
CO2: 21 mmol/L (ref 20–29)
Calcium: 9.3 mg/dL (ref 8.6–10.2)
Chloride: 104 mmol/L (ref 96–106)
Creatinine, Ser: 1.09 mg/dL (ref 0.76–1.27)
Globulin, Total: 2.7 g/dL (ref 1.5–4.5)
Glucose: 137 mg/dL — ABNORMAL HIGH (ref 70–99)
Potassium: 4.2 mmol/L (ref 3.5–5.2)
Sodium: 141 mmol/L (ref 134–144)
Total Protein: 6.8 g/dL (ref 6.0–8.5)
eGFR: 76 mL/min/{1.73_m2} (ref >59)

## 2023-03-03 LAB — BAYER DCA HB A1C WAIVED: HB A1C (BAYER DCA - WAIVED): 6 % — ABNORMAL HIGH (ref 4.8–5.6)

## 2023-03-03 NOTE — H&P (Signed)
Wayne Munoz; [id number]; May 31, 1956   HPI Patient is a 67 year old white male who was referred to my care by Dr. Lajuana Ripple for evaluation and treatment of biliary colic secondary to cholelithiasis.  Approximately 4 weeks ago, the patient started having episode of right flank pain and lower pelvic pain.  There was initially thought that he had a kidney stone but his urinalysis was negative.  He had a abdominal film which showed 2 gallstones in the right upper quadrant.  Patient states the episode has since resolved.  He denied any fever, chills, or jaundice.  He does state that this may have been associated with food, though he has had no specific episodes of fatty food intolerance.  He is trying to avoid greasy foods in general.  He currently feels fine other than some right upper back pain.  This appears to be his first episode. Past Medical History:  Diagnosis Date   Psoriasis     Past Surgical History:  Procedure Laterality Date   KNEE ARTHROSCOPY Right     History reviewed. No pertinent family history.  Current Outpatient Medications on File Prior to Visit  Medication Sig Dispense Refill   TREMFYA 100 MG/ML SOPN Inject into the skin.     cyclobenzaprine (FLEXERIL) 5 MG tablet Take 1 tablet (5 mg total) by mouth 3 (three) times daily as needed for muscle spasms. (Patient not taking: Reported on 11/05/2022) 30 tablet 0   naproxen (NAPROSYN) 500 MG tablet Take 1 tablet (500 mg total) by mouth 2 (two) times daily with a meal. (Patient not taking: Reported on 11/05/2022) 60 tablet 1   No current facility-administered medications on file prior to visit.    No Known Allergies  Social History   Substance and Sexual Activity  Alcohol Use No   Alcohol/week: 0.0 standard drinks of alcohol    Social History   Tobacco Use  Smoking Status Never  Smokeless Tobacco Never    Review of Systems  Constitutional: Negative.   HENT: Negative.    Eyes: Negative.   Respiratory: Negative.     Cardiovascular: Negative.   Gastrointestinal:  Positive for abdominal pain.  Genitourinary: Negative.   Musculoskeletal:  Positive for back pain and joint pain.  Skin: Negative.   Neurological: Negative.   Endo/Heme/Allergies: Negative.   Psychiatric/Behavioral: Negative.      Objective   Vitals:   11/05/22 0922  BP: (!) 180/95  Pulse: 64  Resp: 14  Temp: 98 F (36.7 C)  SpO2: 94%    Physical Exam Vitals reviewed.  Constitutional:      Appearance: Normal appearance. He is obese. He is not ill-appearing.  HENT:     Head: Normocephalic and atraumatic.  Eyes:     General: No scleral icterus. Cardiovascular:     Rate and Rhythm: Normal rate and regular rhythm.     Heart sounds: Normal heart sounds. No murmur heard.    No friction rub. No gallop.  Pulmonary:     Effort: Pulmonary effort is normal. No respiratory distress.     Breath sounds: Normal breath sounds. No stridor. No wheezing, rhonchi or rales.  Abdominal:     General: Bowel sounds are normal. There is no distension.     Palpations: Abdomen is soft. There is no mass.     Tenderness: There is no abdominal tenderness. There is no guarding or rebound.     Hernia: No hernia is present.  Skin:    General: Skin is warm and dry.  Neurological:  Mental Status: He is alert and oriented to person, place, and time.    Primary care notes reviewed Assessment  Cholelithiasis with an episode of biliary colic, atypical Plan  Patient is scheduled for robotic assisted laparoscopic cholecystectomy on 03/19/2023.  The risks and benefits of the procedure including bleeding, infection, hepatobiliary injury, the possibility of an open procedure, and the possibility of recurrence of his symptoms were fully explained to the patient, who gave informed consent.

## 2023-03-03 NOTE — Progress Notes (Signed)
Subjective:     Wayne Munoz  Patient presents back to discuss surgery for cholelithiasis.  He continues to have intermittent right flank and back pain.  The etiology of this has not been found and his workup only reveals cholelithiasis.  He denies any fatty food intolerance.  He occasionally has right upper quadrant discomfort.  He denies any fever, chills, or jaundice. Objective:    BP (!) 157/87   Pulse 71   Temp 97.9 F (36.6 C) (Oral)   Resp 14   Ht 6' (1.829 m)   Wt 267 lb (121.1 kg)   SpO2 97%   BMI 36.21 kg/m   General:  alert, cooperative, and no distress  Head is normocephalic, atraumatic Eyes are without scleral icterus Lungs are clear to auscultation with equal breath sounds bilaterally Heart examination reveals a regular rate and rhythm without S3, S4, murmurs Abdomen is soft with minimal tenderness to the right upper quadrant to palpation.  No rigidity is noted.  No hernias are noted. Back examination unremarkable Previous office notes reviewed     Assessment:    Cholelithiasis with atypical right flank pain    Plan:   Will proceed with robotic assisted laparoscopic cholecystectomy on 03/19/2023.  The risks and benefits of the procedure including bleeding, infection, hepatobiliary injury, the possibility of an open procedure, and the possibility of recurrence of his symptoms were fully explained to the patient, who gave informed consent.

## 2023-03-09 ENCOUNTER — Encounter: Payer: Self-pay | Admitting: Urology

## 2023-03-09 ENCOUNTER — Ambulatory Visit: Payer: BC Managed Care – PPO | Admitting: Urology

## 2023-03-09 VITALS — BP 153/86 | HR 66 | Ht 72.0 in | Wt 250.0 lb

## 2023-03-09 DIAGNOSIS — R399 Unspecified symptoms and signs involving the genitourinary system: Secondary | ICD-10-CM | POA: Diagnosis not present

## 2023-03-09 LAB — BLADDER SCAN AMB NON-IMAGING

## 2023-03-09 LAB — URINALYSIS, ROUTINE W REFLEX MICROSCOPIC
Bilirubin, UA: NEGATIVE
Glucose, UA: NEGATIVE
Ketones, UA: NEGATIVE
Leukocytes,UA: NEGATIVE
Nitrite, UA: NEGATIVE
Protein,UA: NEGATIVE
RBC, UA: NEGATIVE
Specific Gravity, UA: 1.02 (ref 1.005–1.030)
Urobilinogen, Ur: 0.2 mg/dL (ref 0.2–1.0)
pH, UA: 7 (ref 5.0–7.5)

## 2023-03-09 NOTE — Progress Notes (Signed)
   Assessment: 1. Lower urinary tract symptoms (LUTS)     Plan: Continue tamsulosin Follow-up 6 months for recheck with symptom assessment  Chief Complaint: luts  HPI: Wayne Munoz is a 67 y.o. male who presents for continued evaluation of LUTS. At the time of his initial visit 12/2022 the patient was started on tamsulosin. Patient has noted significant improvement in his lower urinary tract symptoms. IPSS = 7  Please see my note 12/08/2022 at the time of initial visit for detailed history and exam. Predominant symptoms were urinary frequency and incomplete emptying as well as suprapubic pressure that would be relieved with voiding. Baseline IPSS = 12.  Urinalyses have been normal. DRE revealed a 30 g prostate without evidence of nodules or induration. No family history of prostate cancer.  PSA data: 11/2022  0.4  Portions of the above documentation were copied from a prior visit for review purposes only.  Allergies: No Known Allergies  PMH: Past Medical History:  Diagnosis Date   Psoriasis     PSH: Past Surgical History:  Procedure Laterality Date   KNEE ARTHROSCOPY Right     SH: Social History   Tobacco Use   Smoking status: Never   Smokeless tobacco: Never  Vaping Use   Vaping Use: Never used  Substance Use Topics   Alcohol use: No    Alcohol/week: 0.0 standard drinks of alcohol   Drug use: No    ROS: Constitutional:  Negative for fever, chills, weight loss CV: Negative for chest pain, previous MI, hypertension Respiratory:  Negative for shortness of breath, wheezing, sleep apnea, frequent cough GI:  Negative for nausea, vomiting, bloody stool, GERD  PE: BP (!) 153/86   Pulse 66   Ht 6' (1.829 m)   Wt 250 lb (113.4 kg)   BMI 33.91 kg/m  GENERAL APPEARANCE:  Well appearing, well developed, well nourished, NAD    Results: Results for orders placed or performed in visit on 03/09/23 (from the past 24 hour(s))  BLADDER SCAN AMB NON-IMAGING    Collection Time: 03/09/23  9:03 AM  Result Value Ref Range   Scan Result 3ml

## 2023-03-10 ENCOUNTER — Encounter: Payer: Self-pay | Admitting: Family Medicine

## 2023-03-10 ENCOUNTER — Ambulatory Visit (INDEPENDENT_AMBULATORY_CARE_PROVIDER_SITE_OTHER): Payer: BC Managed Care – PPO | Admitting: Family Medicine

## 2023-03-10 VITALS — BP 124/82 | HR 68 | Temp 98.3°F | Ht 72.0 in | Wt 267.0 lb

## 2023-03-10 DIAGNOSIS — Z0001 Encounter for general adult medical examination with abnormal findings: Secondary | ICD-10-CM | POA: Diagnosis not present

## 2023-03-10 DIAGNOSIS — E782 Mixed hyperlipidemia: Secondary | ICD-10-CM | POA: Diagnosis not present

## 2023-03-10 DIAGNOSIS — Z6836 Body mass index (BMI) 36.0-36.9, adult: Secondary | ICD-10-CM

## 2023-03-10 DIAGNOSIS — Z23 Encounter for immunization: Secondary | ICD-10-CM | POA: Diagnosis not present

## 2023-03-10 DIAGNOSIS — K8020 Calculus of gallbladder without cholecystitis without obstruction: Secondary | ICD-10-CM

## 2023-03-10 DIAGNOSIS — R399 Unspecified symptoms and signs involving the genitourinary system: Secondary | ICD-10-CM

## 2023-03-10 DIAGNOSIS — Z0000 Encounter for general adult medical examination without abnormal findings: Secondary | ICD-10-CM

## 2023-03-10 DIAGNOSIS — R7303 Prediabetes: Secondary | ICD-10-CM

## 2023-03-10 NOTE — Progress Notes (Signed)
Wayne Munoz is a 67 y.o. male presents to office today for annual physical exam examination.    Concerns today include: 1.  None.  He reports that he is doing relatively well.  Had fasting labs drawn.  Diet: Typical American but has been really trying eliminate junk foods in efforts to reduce risk of progression into type 2 diabetes, Exercise: He does not have any structured exercise but does try to stay active Last colonoscopy: UTD Immunizations needed: Immunization History  Administered Date(s) Administered   Janssen (J&J) SARS-COV-2 Vaccination 01/14/2020     Past Medical History:  Diagnosis Date   Psoriasis    Social History   Socioeconomic History   Marital status: Married    Spouse name: Not on file   Number of children: Not on file   Years of education: Not on file   Highest education level: Not on file  Occupational History   Occupation: Retired  Tobacco Use   Smoking status: Never   Smokeless tobacco: Never  Vaping Use   Vaping Use: Never used  Substance and Sexual Activity   Alcohol use: No    Alcohol/week: 0.0 standard drinks of alcohol   Drug use: No   Sexual activity: Not on file  Other Topics Concern   Not on file  Social History Narrative   Wife, Misty Stanley   He is retired from [employer]   Social Determinants of Corporate investment banker Strain: Not on [employer] Insecurity: Not on file  Transportation Needs: Not on file  Physical Activity: Not on file  Stress: Not on file  Social Connections: Not on file  Intimate Partner Violence: Not on file   Past Surgical History:  Procedure Laterality Date   KNEE ARTHROSCOPY Right    No family history on file.  Current Outpatient Medications:    naproxen (EC NAPROSYN) 500 MG EC tablet, Take 500 mg by mouth daily as needed., Disp: , Rfl:    tamsulosin (FLOMAX) 0.4 MG CAPS capsule, Take 1 capsule (0.4 mg total) by mouth daily., Disp: 90 capsule, Rfl: 3   TREMFYA 100 MG/ML SOPN, Inject into the  skin., Disp: , Rfl:   No Known Allergies   ROS: Review of Systems A comprehensive review of systems was negative except for: Genitourinary: positive for frequency Musculoskeletal: positive for back pain    Physical exam BP 124/82   Pulse 68   Temp 98.3 F (36.8 C)   Ht 6' (1.829 m)   Wt 267 lb (121.1 kg)   SpO2 97%   BMI 36.21 kg/m  General appearance: alert, cooperative, appears stated age, no distress, and moderately obese Head: Normocephalic, without obvious abnormality, atraumatic Eyes: negative findings: lids and lashes normal, conjunctivae and sclerae normal, corneas clear, and pupils equal, round, reactive to light and accomodation Ears: normal TM's and external ear canals both ears Nose: Nares normal. Septum midline. Mucosa normal. No drainage or sinus tenderness. Throat: lips, mucosa, and tongue normal; teeth and gums normal Neck: no adenopathy, no carotid bruit, supple, symmetrical, trachea midline, and thyroid not enlarged, symmetric, no tenderness/mass/nodules Back: symmetric, no curvature. ROM normal. No CVA tenderness. Lungs: clear to auscultation bilaterally Chest wall: no tenderness Heart: regular rate and rhythm, S1, S2 normal, no murmur, click, rub or gallop Abdomen: soft, non-tender; bowel sounds normal; no masses,  no organomegaly Extremities: extremities normal, atraumatic, no cyanosis or edema Pulses: 2+ and symmetric Skin: Skin color, texture, turgor normal. No rashes or lesions Lymph nodes: Cervical, supraclavicular, and  axillary nodes normal. Neurologic: Grossly normal    03/10/2023    7:57 AM 10/27/2022    9:05 AM 08/21/2022    2:23 PM  Depression screen PHQ 2/9  Decreased Interest 0 0 0  Down, Depressed, Hopeless 0 0 0  PHQ - 2 Score 0 0 0  Altered sleeping 0 0   Tired, decreased energy 0 0   Change in appetite 0 0   Feeling bad or failure about yourself  0 0   Trouble concentrating 0 0   Moving slowly or fidgety/restless 0 0   Suicidal  thoughts 0 0   PHQ-9 Score 0 0   Difficult doing work/chores Not difficult at all Not difficult at all        Assessment/ Plan: [name] here for annual physical exam.   Annual physical exam  Morbid obesity (HCC)  Mixed hyperlipidemia  Pre-diabetes  Gallstones  Lower urinary tract symptoms (LUTS)  We reviewed his lab results.  Continue diet modification and increase physical exercise to reduce cardiovascular risks associated with morbid obesity, hyperlipidemia and prediabetes  Scheduled for evaluation/excision of gallbladder soon.  Hopefully this will resolve this right-sided back pain he has been experiencing  Continue to follow-up with urology for LUTS.  Counseled on healthy lifestyle choices, including diet (rich in fruits, vegetables and lean meats and low in salt and simple carbohydrates) and exercise (at least 30 minutes of moderate physical activity daily).  Patient to follow up in 1 year for annual exam or sooner if needed.   M. Nadine Counts, DO

## 2023-03-16 NOTE — Patient Instructions (Signed)
Wayne Munoz  03/16/2023     @PREFPERIOPPHARMACY @   Your procedure is scheduled on  03/19/2023.   Report to [facility] at  0600  A.M.   Call this number if you have problems the morning of surgery:  [phone]  If you experience any cold or flu symptoms such as cough, fever, chills, shortness of breath, etc. between now and your scheduled surgery, please notify us at the above number.   Remember:  Do not eat or drink after midnight.      Take these medicines the morning of surgery with A SIP OF WATER                                                flomax     Do not wear jewelry, make-up or nail polish.  Do not wear lotions, powders, or perfumes, or deodorant.  Do not shave 48 hours prior to surgery.  Men may shave face and neck.  Do not bring valuables to the hospital.  [facility] is not responsible for any belongings or valuables.  Contacts, dentures or bridgework may not be worn into surgery.  Leave your suitcase in the car.  After surgery it may be brought to your room.  For patients admitted to the hospital, discharge time will be determined by your treatment team.  Patients discharged the day of surgery will not be allowed to drive home and must have someone with them for 24 hours.    Special instructions:   DO NOT smoke tobacco or vape for 24 hours before your procedure.  Please read over the following fact sheets that you were given. Pain Booklet, Coughing and Deep Breathing, Surgical Site Infection Prevention, Anesthesia Post-op Instructions, and Care and Recovery After Surgery      Minimally Invasive Cholecystectomy, Care After The following information offers guidance on how to care for yourself after your procedure. Your health care provider may also give you more specific instructions. If you have problems or questions, contact your health care provider. What can I expect after the procedure? After the procedure, it is common to  have: Pain at your incision sites. You will be given medicines to control this pain. Mild nausea or vomiting. Bloating and possible shoulder pain from the gas that was used during the procedure. Follow these instructions at home: Medicines Take over-the-counter and prescription medicines only as told by your health care provider. If you were prescribed an antibiotic medicine, take it as told by your health care provider. Do not stop using the antibiotic even if you start to feel better. Ask your health care provider if the medicine prescribed to you: Requires you to avoid driving or using machinery. Can cause constipation. You may need to take these actions to prevent or treat constipation: Drink enough fluid to keep your urine pale yellow. Take over-the-counter or prescription medicines. Eat foods that are high in fiber, such as beans, whole grains, and fresh fruits and vegetables. Limit foods that are high in fat and processed sugars, such as fried or sweet foods. Incision care  Follow instructions from your health care provider about how to take care of your incisions. Make sure you: Wash your hands with soap and water for at least 20 seconds before and after you change your bandage (dressing). If soap and  water are not available, use hand sanitizer. Change your dressing as told by your health care provider. Leave stitches (sutures), skin glue, or adhesive strips in place. These skin closures may need to be in place for 2 weeks or longer. If adhesive strip edges start to loosen and curl up, you may trim the loose edges. Do not remove adhesive strips completely unless your health care provider tells you to do that. Do not take baths, swim, or use a hot tub until your health care provider approves. Ask your health care provider if you may take showers. You may only be allowed to take sponge baths. Check your incision area every day for signs of infection. Check for: More redness, swelling, or  pain. Fluid or blood. Warmth. Pus or a bad smell. Activity Rest as told by your health care provider. Do not do activities that require a lot of effort. Avoid sitting for a long time without moving. Get up to take short walks every 1-2 hours. This is important to improve blood flow and breathing. Ask for help if you feel weak or unsteady. Do not lift anything that is heavier than 10 lb (4.5 kg), or the limit that you are told, until your health care provider says that it is safe. Do not play contact sports until your health care provider approves. Do not return to work or school until your health care provider approves. Return to your normal activities as told by your health care provider. Ask your health care provider what activities are safe for you. General instructions If you were given a sedative during the procedure, it can affect you for several hours. Do not drive or operate machinery until your health care provider says that it is safe. Keep all follow-up visits. This is important. Contact a health care provider if: You develop a rash. You have more redness, swelling, or pain around your incisions. You have fluid or blood coming from your incisions. Your incisions feel warm to the touch. You have pus or a bad smell coming from your incisions. You have a fever. One or more of your incisions breaks open. Get help right away if: You have trouble breathing. You have chest pain. You have more pain in your shoulders. You faint or feel dizzy when you stand. You have severe pain in your abdomen. You have nausea or vomiting that lasts for more than one day. You have leg pain that is new or unusual, or if it is localized to one specific spot. These symptoms may represent a serious problem that is an emergency. Do not wait to see if the symptoms will go away. Get medical help right away. Call your local emergency services (911 in the [place] Do not drive yourself to the  hospital. Summary After your procedure, it is common to have pain at the incision sites. You may also have nausea or bloating. Follow your health care provider's instructions about medicine, activity restrictions, and caring for your incision areas. Do not do activities that require a lot of effort. Contact a health care provider if you have a fever or other signs of infection, such as more redness, swelling, or pain around the incisions. Get help right away if you have chest pain, increasing pain in the shoulders, or trouble breathing. This information is not intended to replace advice given to you by your health care provider. Make sure you discuss any questions you have with your health care provider. Document Revised: 04/22/2021 Document Reviewed: 04/22/2021 Elsevier Patient  Education  2023 [employer] General Anesthesia, Adult, Care After The following information offers guidance on how to care for yourself after your procedure. Your health care provider may also give you more specific instructions. If you have problems or questions, contact your health care provider. What can I expect after the procedure? After the procedure, it is common for people to: Have pain or discomfort at the IV site. Have nausea or vomiting. Have a sore throat or hoarseness. Have trouble concentrating. Feel cold or chills. Feel weak, sleepy, or tired (fatigue). Have soreness and body aches. These can affect parts of the body that were not involved in surgery. Follow these instructions at home: For the time period you were told by your health care provider:  Rest. Do not participate in activities where you could fall or become injured. Do not drive or use machinery. Do not drink alcohol. Do not take sleeping pills or medicines that cause drowsiness. Do not make important decisions or sign legal documents. Do not take care of children on your own. General instructions Drink enough fluid to keep your  urine pale yellow. If you have sleep apnea, surgery and certain medicines can increase your risk for breathing problems. Follow instructions from your health care provider about wearing your sleep device: Anytime you are sleeping, including during daytime naps. While taking prescription pain medicines, sleeping medicines, or medicines that make you drowsy. Return to your normal activities as told by your health care provider. Ask your health care provider what activities are safe for you. Take over-the-counter and prescription medicines only as told by your health care provider. Do not use any products that contain nicotine or tobacco. These products include cigarettes, chewing tobacco, and vaping devices, such as e-cigarettes. These can delay incision healing after surgery. If you need help quitting, ask your health care provider. Contact a health care provider if: You have nausea or vomiting that does not get better with medicine. You vomit every time you eat or drink. You have pain that does not get better with medicine. You cannot urinate or have bloody urine. You develop a skin rash. You have a fever. Get help right away if: You have trouble breathing. You have chest pain. You vomit blood. These symptoms may be an emergency. Get help right away. Call 911. Do not wait to see if the symptoms will go away. Do not drive yourself to the hospital. Summary After the procedure, it is common to have a sore throat, hoarseness, nausea, vomiting, or to feel weak, sleepy, or fatigue. For the time period you were told by your health care provider, do not drive or use machinery. Get help right away if you have difficulty breathing, have chest pain, or vomit blood. These symptoms may be an emergency. This information is not intended to replace advice given to you by your health care provider. Make sure you discuss any questions you have with your health care provider. Document Revised: 01/16/2022  Document Reviewed: 01/16/2022 Elsevier Patient Education  2023 [employer]. How to Use Chlorhexidine Before Surgery Chlorhexidine gluconate (CHG) is a germ-killing (antiseptic) solution that is used to clean the skin. It can get rid of the bacteria that normally live on the skin and can keep them away for about 24 hours. To clean your skin with CHG, you may be given: A CHG solution to use in the shower or as part of a sponge bath. A prepackaged cloth that contains CHG. Cleaning your skin with CHG may help lower  the risk for infection: While you are staying in the intensive care unit of the hospital. If you have a vascular access, such as a central line, to provide short-term or long-term access to your veins. If you have a catheter to drain urine from your bladder. If you are on a ventilator. A ventilator is a machine that helps you breathe by moving air in and out of your lungs. After surgery. What are the risks? Risks of using CHG include: A skin reaction. Hearing loss, if CHG gets in your ears and you have a perforated eardrum. Eye injury, if CHG gets in your eyes and is not rinsed out. The CHG product catching fire. Make sure that you avoid smoking and flames after applying CHG to your skin. Do not use CHG: If you have a chlorhexidine allergy or have previously reacted to chlorhexidine. On babies younger than 18 months of age. How to use CHG solution Use CHG only as told by your health care provider, and follow the instructions on the label. Use the full amount of CHG as directed. Usually, this is one bottle. During a shower Follow these steps when using CHG solution during a shower (unless your health care provider gives you different instructions): Start the shower. Use your normal soap and shampoo to wash your face and hair. Turn off the shower or move out of the shower stream. Pour the CHG onto a clean washcloth. Do not use any type of brush or rough-edged sponge. Starting at  your neck, lather your body down to your toes. Make sure you follow these instructions: If you will be having surgery, pay special attention to the part of your body where you will be having surgery. Scrub this area for at least 1 minute. Do not use CHG on your head or face. If the solution gets into your ears or eyes, rinse them well with water. Avoid your genital area. Avoid any areas of skin that have broken skin, cuts, or scrapes. Scrub your back and under your arms. Make sure to wash skin folds. Let the lather sit on your skin for 1-2 minutes or as long as told by your health care provider. Thoroughly rinse your entire body in the shower. Make sure that all body creases and crevices are rinsed well. Dry off with a clean towel. Do not put any substances on your body afterward--such as powder, lotion, or perfume--unless you are told to do so by your health care provider. Only use lotions that are recommended by the manufacturer. Put on clean clothes or pajamas. If it is the night before your surgery, sleep in clean sheets.  During a sponge bath Follow these steps when using CHG solution during a sponge bath (unless your health care provider gives you different instructions): Use your normal soap and shampoo to wash your face and hair. Pour the CHG onto a clean washcloth. Starting at your neck, lather your body down to your toes. Make sure you follow these instructions: If you will be having surgery, pay special attention to the part of your body where you will be having surgery. Scrub this area for at least 1 minute. Do not use CHG on your head or face. If the solution gets into your ears or eyes, rinse them well with water. Avoid your genital area. Avoid any areas of skin that have broken skin, cuts, or scrapes. Scrub your back and under your arms. Make sure to wash skin folds. Let the lather sit on your skin  for 1-2 minutes or as long as told by your health care provider. Using a  different clean, wet washcloth, thoroughly rinse your entire body. Make sure that all body creases and crevices are rinsed well. Dry off with a clean towel. Do not put any substances on your body afterward--such as powder, lotion, or perfume--unless you are told to do so by your health care provider. Only use lotions that are recommended by the manufacturer. Put on clean clothes or pajamas. If it is the night before your surgery, sleep in clean sheets. How to use CHG prepackaged cloths Only use CHG cloths as told by your health care provider, and follow the instructions on the label. Use the CHG cloth on clean, dry skin. Do not use the CHG cloth on your head or face unless your health care provider tells you to. When washing with the CHG cloth: Avoid your genital area. Avoid any areas of skin that have broken skin, cuts, or scrapes. Before surgery Follow these steps when using a CHG cloth to clean before surgery (unless your health care provider gives you different instructions): Using the CHG cloth, vigorously scrub the part of your body where you will be having surgery. Scrub using a back-and-forth motion for 3 minutes. The area on your body should be completely wet with CHG when you are done scrubbing. Do not rinse. Discard the cloth and let the area air-dry. Do not put any substances on the area afterward, such as powder, lotion, or perfume. Put on clean clothes or pajamas. If it is the night before your surgery, sleep in clean sheets.  For general bathing Follow these steps when using CHG cloths for general bathing (unless your health care provider gives you different instructions). Use a separate CHG cloth for each area of your body. Make sure you wash between any folds of skin and between your fingers and toes. Wash your body in the following order, switching to a new cloth after each step: The front of your neck, shoulders, and chest. Both of your arms, under your arms, and your  hands. Your stomach and groin area, avoiding the genitals. Your right leg and foot. Your left leg and foot. The back of your neck, your back, and your buttocks. Do not rinse. Discard the cloth and let the area air-dry. Do not put any substances on your body afterward--such as powder, lotion, or perfume--unless you are told to do so by your health care provider. Only use lotions that are recommended by the manufacturer. Put on clean clothes or pajamas. Contact a health care provider if: Your skin gets irritated after scrubbing. You have questions about using your solution or cloth. You swallow any chlorhexidine. Call your local poison control center ([phone] in the [place] Get help right away if: Your eyes itch badly, or they become very red or swollen. Your skin itches badly and is red or swollen. Your hearing changes. You have trouble seeing. You have swelling or tingling in your mouth or throat. You have trouble breathing. These symptoms may represent a serious problem that is an emergency. Do not wait to see if the symptoms will go away. Get medical help right away. Call your local emergency services (911 in the [place] Do not drive yourself to the hospital. Summary Chlorhexidine gluconate (CHG) is a germ-killing (antiseptic) solution that is used to clean the skin. Cleaning your skin with CHG may help to lower your risk for infection. You may be given CHG to use for bathing. It  may be in a bottle or in a prepackaged cloth to use on your skin. Carefully follow your health care provider's instructions and the instructions on the product label. Do not use CHG if you have a chlorhexidine allergy. Contact your health care provider if your skin gets irritated after scrubbing. This information is not intended to replace advice given to you by your health care provider. Make sure you discuss any questions you have with your health care provider. Document Revised: 02/16/2022 Document  Reviewed: 12/30/2020 Elsevier Patient Education  2023 [employer].

## 2023-03-17 ENCOUNTER — Encounter (HOSPITAL_COMMUNITY): Payer: Self-pay

## 2023-03-17 ENCOUNTER — Encounter (HOSPITAL_COMMUNITY)
Admission: RE | Admit: 2023-03-17 | Discharge: 2023-03-17 | Disposition: A | Discharge status: Home / Self Care | Payer: BC Managed Care – PPO | Source: Ambulatory Visit | Attending: General Surgery | Admitting: General Surgery

## 2023-03-17 DIAGNOSIS — Z01818 Encounter for other preprocedural examination: Secondary | ICD-10-CM | POA: Insufficient documentation

## 2023-03-17 DIAGNOSIS — K8064 Calculus of gallbladder and bile duct with chronic cholecystitis without obstruction: Secondary | ICD-10-CM | POA: Diagnosis not present

## 2023-03-17 LAB — COMPREHENSIVE METABOLIC PANEL
ALT: 19 U/L (ref 0–44)
AST: 18 U/L (ref 15–41)
Albumin: 3.9 g/dL (ref 3.5–5.0)
Alkaline Phosphatase: 61 U/L (ref 38–126)
Anion gap: 9 (ref 5–15)
BUN: 11 mg/dL (ref 8–23)
CO2: 24 mmol/L (ref 22–32)
Calcium: 9.1 mg/dL (ref 8.9–10.3)
Chloride: 105 mmol/L (ref 98–111)
Creatinine, Ser: 1.08 mg/dL (ref 0.61–1.24)
GFR, Estimated: >60 mL/min (ref >60)
Glucose, Bld: 99 mg/dL (ref 70–99)
Potassium: 3.9 mmol/L (ref 3.5–5.1)
Sodium: 138 mmol/L (ref 135–145)
Total Bilirubin: 1.2 mg/dL (ref 0.3–1.2)
Total Protein: 7.5 g/dL (ref 6.5–8.1)

## 2023-03-17 LAB — CBC WITH DIFFERENTIAL/PLATELET
Abs Immature Granulocytes: 0.01 10*3/uL (ref 0.00–0.07)
Basophils Absolute: 0 10*3/uL (ref 0.0–0.1)
Basophils Relative: 1 %
Eosinophils Absolute: 0.1 10*3/uL (ref 0.0–0.5)
Eosinophils Relative: 3 %
HCT: 43.8 % (ref 39.0–52.0)
Hemoglobin: 14.9 g/dL (ref 13.0–17.0)
Immature Granulocytes: 0 %
Lymphocytes Relative: 28 %
Lymphs Abs: 1.3 10*3/uL (ref 0.7–4.0)
MCH: 29.7 pg (ref 26.0–34.0)
MCHC: 34 g/dL (ref 30.0–36.0)
MCV: 87.3 fL (ref 80.0–100.0)
Monocytes Absolute: 0.4 10*3/uL (ref 0.1–1.0)
Monocytes Relative: 9 %
Neutro Abs: 2.8 10*3/uL (ref 1.7–7.7)
Neutrophils Relative %: 59 %
Platelets: 173 10*3/uL (ref 150–400)
RBC: 5.02 MIL/uL (ref 4.22–5.81)
RDW: 12.9 % (ref 11.5–15.5)
WBC: 4.6 10*3/uL (ref 4.0–10.5)
nRBC: 0 % (ref 0.0–0.2)

## 2023-03-19 ENCOUNTER — Ambulatory Visit (HOSPITAL_COMMUNITY)
Admission: RE | Admit: 2023-03-19 | Discharge: 2023-03-19 | Disposition: A | Discharge status: Home / Self Care | Payer: BC Managed Care – PPO | Attending: General Surgery | Admitting: General Surgery

## 2023-03-19 ENCOUNTER — Ambulatory Visit (HOSPITAL_COMMUNITY): Payer: BC Managed Care – PPO | Admitting: Anesthesiology

## 2023-03-19 ENCOUNTER — Encounter (HOSPITAL_COMMUNITY): Payer: Self-pay | Admitting: General Surgery

## 2023-03-19 ENCOUNTER — Encounter (HOSPITAL_COMMUNITY)
Admission: RE | Disposition: A | Discharge status: Home / Self Care | Payer: Self-pay | Source: Home / Self Care | Attending: General Surgery

## 2023-03-19 ENCOUNTER — Other Ambulatory Visit: Payer: Self-pay

## 2023-03-19 DIAGNOSIS — K8064 Calculus of gallbladder and bile duct with chronic cholecystitis without obstruction: Secondary | ICD-10-CM | POA: Insufficient documentation

## 2023-03-19 DIAGNOSIS — K8020 Calculus of gallbladder without cholecystitis without obstruction: Secondary | ICD-10-CM | POA: Diagnosis not present

## 2023-03-19 DIAGNOSIS — Z01818 Encounter for other preprocedural examination: Secondary | ICD-10-CM

## 2023-03-19 DIAGNOSIS — K8070 Calculus of gallbladder and bile duct without cholecystitis without obstruction: Secondary | ICD-10-CM | POA: Diagnosis not present

## 2023-03-19 DIAGNOSIS — K8010 Calculus of gallbladder with chronic cholecystitis without obstruction: Secondary | ICD-10-CM | POA: Diagnosis not present

## 2023-03-19 SURGERY — CHOLECYSTECTOMY, ROBOT-ASSISTED, LAPAROSCOPIC
Anesthesia: General | Site: Abdomen

## 2023-03-19 MED ORDER — PROPOFOL 10 MG/ML IV BOLUS
INTRAVENOUS | Status: AC
  Filled 2023-03-19: qty 20

## 2023-03-19 MED ORDER — INDOCYANINE GREEN 25 MG IV SOLR
INTRAVENOUS | Status: AC
  Administered 2023-03-19: 5 mg via INTRAVENOUS
  Filled 2023-03-19: qty 10

## 2023-03-19 MED ORDER — CHLORHEXIDINE GLUCONATE CLOTH 2 % EX PADS: 6.0000 | MEDICATED_PAD | Freq: Once | CUTANEOUS | Status: DC

## 2023-03-19 MED ORDER — INDOCYANINE GREEN 25 MG IV SOLR: 5.0000 mg | Freq: Once | INTRAVENOUS | Status: AC

## 2023-03-19 MED ORDER — ONDANSETRON HCL 4 MG/2ML IJ SOLN: 4.0000 mg | Freq: Once | INTRAMUSCULAR | Status: DC | PRN

## 2023-03-19 MED ORDER — KETOROLAC TROMETHAMINE 30 MG/ML IJ SOLN
30.0000 mg | Freq: Once | INTRAMUSCULAR | Status: AC
  Administered 2023-03-19: 30 mg via INTRAVENOUS
  Filled 2023-03-19: qty 1

## 2023-03-19 MED ORDER — PROPOFOL 10 MG/ML IV BOLUS
INTRAVENOUS | Status: DC | PRN
  Administered 2023-03-19: 250 mg via INTRAVENOUS

## 2023-03-19 MED ORDER — HYDROMORPHONE HCL 1 MG/ML IJ SOLN
0.2500 mg | INTRAMUSCULAR | Status: DC | PRN
  Administered 2023-03-19 (×2): 0.5 mg via INTRAVENOUS
  Filled 2023-03-19: qty 0.5

## 2023-03-19 MED ORDER — CEFAZOLIN IN SODIUM CHLORIDE 3-0.9 GM/100ML-% IV SOLN
INTRAVENOUS | Status: AC
  Filled 2023-03-19: qty 100

## 2023-03-19 MED ORDER — HYDROMORPHONE HCL 1 MG/ML IJ SOLN
INTRAMUSCULAR | Status: AC
  Filled 2023-03-19: qty 0.5

## 2023-03-19 MED ORDER — ORAL CARE MOUTH RINSE: 15.0000 mL | Freq: Once | OROMUCOSAL | Status: AC

## 2023-03-19 MED ORDER — CHLORHEXIDINE GLUCONATE 0.12 % MT SOLN: 15.0000 mL | Freq: Once | OROMUCOSAL | Status: AC

## 2023-03-19 MED ORDER — MEPERIDINE HCL 50 MG/ML IJ SOLN: 6.2500 mg | INTRAMUSCULAR | Status: DC | PRN

## 2023-03-19 MED ORDER — MIDAZOLAM HCL 2 MG/2ML IJ SOLN
INTRAMUSCULAR | Status: AC
  Filled 2023-03-19: qty 2

## 2023-03-19 MED ORDER — BUPIVACAINE HCL (PF) 0.5 % IJ SOLN
INTRAMUSCULAR | Status: DC | PRN
  Administered 2023-03-19: 30 mL

## 2023-03-19 MED ORDER — HEMOSTATIC AGENTS (NO CHARGE) OPTIME
TOPICAL | Status: DC | PRN
  Administered 2023-03-19: 1 via TOPICAL

## 2023-03-19 MED ORDER — ROCURONIUM BROMIDE 10 MG/ML (PF) SYRINGE
PREFILLED_SYRINGE | INTRAVENOUS | Status: AC
  Filled 2023-03-19: qty 10

## 2023-03-19 MED ORDER — CHLORHEXIDINE GLUCONATE 0.12 % MT SOLN
OROMUCOSAL | Status: AC
  Administered 2023-03-19: 15 mL via OROMUCOSAL
  Filled 2023-03-19: qty 15

## 2023-03-19 MED ORDER — SODIUM CHLORIDE 0.9 % IR SOLN
Status: DC | PRN
  Administered 2023-03-19: 3000 mL

## 2023-03-19 MED ORDER — OXYCODONE HCL 5 MG PO TABS: 5.0000 mg | ORAL_TABLET | ORAL | 0 refills | Status: DC | PRN

## 2023-03-19 MED ORDER — ROCURONIUM BROMIDE 10 MG/ML (PF) SYRINGE
PREFILLED_SYRINGE | INTRAVENOUS | Status: DC | PRN
  Administered 2023-03-19: 50 mg via INTRAVENOUS

## 2023-03-19 MED ORDER — DEXAMETHASONE SODIUM PHOSPHATE 10 MG/ML IJ SOLN
INTRAMUSCULAR | Status: DC | PRN
  Administered 2023-03-19: 6 mg via INTRAVENOUS

## 2023-03-19 MED ORDER — CEFAZOLIN IN SODIUM CHLORIDE 3-0.9 GM/100ML-% IV SOLN
3.0000 g | INTRAVENOUS | Status: AC
  Administered 2023-03-19: 3 g via INTRAVENOUS

## 2023-03-19 MED ORDER — ONDANSETRON HCL 4 MG/2ML IJ SOLN
INTRAMUSCULAR | Status: AC
  Filled 2023-03-19: qty 2

## 2023-03-19 MED ORDER — FENTANYL CITRATE (PF) 100 MCG/2ML IJ SOLN
INTRAMUSCULAR | Status: AC
  Filled 2023-03-19: qty 2

## 2023-03-19 MED ORDER — LACTATED RINGERS IV SOLN: INTRAVENOUS | Status: DC

## 2023-03-19 MED ORDER — BUPIVACAINE HCL (PF) 0.5 % IJ SOLN
INTRAMUSCULAR | Status: AC
  Filled 2023-03-19: qty 30

## 2023-03-19 MED ORDER — LIDOCAINE HCL (CARDIAC) PF 100 MG/5ML IV SOSY
PREFILLED_SYRINGE | INTRAVENOUS | Status: DC | PRN
  Administered 2023-03-19: 80 mg via INTRATRACHEAL

## 2023-03-19 MED ORDER — FENTANYL CITRATE (PF) 100 MCG/2ML IJ SOLN
INTRAMUSCULAR | Status: DC | PRN
  Administered 2023-03-19 (×6): 50 ug via INTRAVENOUS

## 2023-03-19 MED ORDER — MIDAZOLAM HCL 2 MG/2ML IJ SOLN
INTRAMUSCULAR | Status: DC | PRN
  Administered 2023-03-19: 2 mg via INTRAVENOUS

## 2023-03-19 MED ORDER — HYDROMORPHONE HCL 1 MG/ML IJ SOLN
INTRAMUSCULAR | Status: DC | PRN
  Administered 2023-03-19: .5 mg via INTRAVENOUS

## 2023-03-19 MED ORDER — DEXAMETHASONE SODIUM PHOSPHATE 10 MG/ML IJ SOLN
INTRAMUSCULAR | Status: AC
  Filled 2023-03-19: qty 1

## 2023-03-19 MED ORDER — LIDOCAINE HCL (PF) 2 % IJ SOLN
INTRAMUSCULAR | Status: AC
  Filled 2023-03-19: qty 5

## 2023-03-19 MED ORDER — SUGAMMADEX SODIUM 200 MG/2ML IV SOLN
INTRAVENOUS | Status: DC | PRN
  Administered 2023-03-19: 250 mg via INTRAVENOUS

## 2023-03-19 MED ORDER — STERILE WATER FOR IRRIGATION IR SOLN
Status: DC | PRN
  Administered 2023-03-19: 500 mL

## 2023-03-19 MED ORDER — ONDANSETRON HCL 4 MG/2ML IJ SOLN
INTRAMUSCULAR | Status: DC | PRN
  Administered 2023-03-19: 4 mg via INTRAVENOUS

## 2023-03-19 SURGICAL SUPPLY — 46 items
ADH SKN CLS APL DERMABOND .7 (GAUZE/BANDAGES/DRESSINGS) ×1
APL ESCP 73.6OZ SRGCL (TIP) ×1
APL PRP STRL LF DISP 70% ISPRP (MISCELLANEOUS) ×1
CAUTERY HOOK MNPLR 1.6 DVNC XI (INSTRUMENTS) ×2 IMPLANT
CHLORAPREP W/TINT 26 (MISCELLANEOUS) ×2 IMPLANT
CLIP LIGATING HEM O LOK PURPLE (MISCELLANEOUS) ×2 IMPLANT
COVER MAYO STAND XLG (MISCELLANEOUS) ×2 IMPLANT
COVER TIP SHEARS 8 DVNC (MISCELLANEOUS) ×2 IMPLANT
DEFOGGER SCOPE WARMER CLEARIFY (MISCELLANEOUS) IMPLANT
DERMABOND ADVANCED .7 DNX12 (GAUZE/BANDAGES/DRESSINGS) ×2 IMPLANT
DRAPE ARM DVNC X/XI (DISPOSABLE) ×8 IMPLANT
DRAPE COLUMN DVNC XI (DISPOSABLE) ×2 IMPLANT
DRAPE HALF SHEET 40X57 (DRAPES) ×2 IMPLANT
ELECT REM PT RETURN 9FT ADLT (ELECTROSURGICAL) ×1
ELECTRODE REM PT RTRN 9FT ADLT (ELECTROSURGICAL) ×2 IMPLANT
FORCEPS BPLR R/ABLATION 8 DVNC (INSTRUMENTS) ×2 IMPLANT
FORCEPS PROGRASP DVNC XI (FORCEP) ×2 IMPLANT
GLOVE BIOGEL PI IND STRL 7.0 (GLOVE) ×4 IMPLANT
GLOVE SURG SS PI 7.5 STRL IVOR (GLOVE) ×4 IMPLANT
GOWN STRL REUS W/TWL LRG LVL3 (GOWN DISPOSABLE) ×6 IMPLANT
GRASPER SUT TROCAR 14GX15 (MISCELLANEOUS) IMPLANT
IRRIGATOR SUCT 8 DISP DVNC XI (IRRIGATION / IRRIGATOR) IMPLANT
IV NS IRRIG 3000ML ARTHROMATIC (IV SOLUTION) IMPLANT
KIT TURNOVER KIT A (KITS) ×2 IMPLANT
MANIFOLD NEPTUNE II (INSTRUMENTS) ×2 IMPLANT
NDL HYPO 21X1.5 SAFETY (NEEDLE) ×2 IMPLANT
NDL INSUFFLATION 14GA 120MM (NEEDLE) ×2 IMPLANT
NEEDLE HYPO 21X1.5 SAFETY (NEEDLE) ×1 IMPLANT
NEEDLE INSUFFLATION 14GA 120MM (NEEDLE) ×1 IMPLANT
OBTURATOR OPTICAL STND 8 DVNC (TROCAR) ×1
OBTURATOR OPTICALSTD 8 DVNC (TROCAR) ×2 IMPLANT
PACK LAP CHOLE LZT030E (CUSTOM PROCEDURE TRAY) ×2 IMPLANT
PAD ARMBOARD 7.5X6 YLW CONV (MISCELLANEOUS) ×2 IMPLANT
PENCIL HANDSWITCHING (ELECTRODE) ×2 IMPLANT
POWDER SURGICEL 3.0 GRAM (HEMOSTASIS) IMPLANT
SCISSORS MNPLR CVD DVNC XI (INSTRUMENTS) ×2 IMPLANT
SEAL CANN UNIV 5-8 DVNC XI (MISCELLANEOUS) ×8 IMPLANT
SET TUBE SMOKE EVAC HIGH FLOW (TUBING) ×2 IMPLANT
SPIKE FLUID TRANSFER (MISCELLANEOUS) ×2 IMPLANT
SUT MNCRL AB 4-0 PS2 18 (SUTURE) ×4 IMPLANT
SUT VICRYL 0 AB UR-6 (SUTURE) ×2 IMPLANT
SYR 30ML LL (SYRINGE) ×2 IMPLANT
SYS RETRIEVAL 5MM INZII UNIV (BASKET) ×1
SYSTEM RETRIEVL 5MM INZII UNIV (BASKET) ×2 IMPLANT
TIP ENDOSCOPIC SURGICEL (TIP) IMPLANT
WATER STERILE IRR 500ML POUR (IV SOLUTION) ×2 IMPLANT

## 2023-03-19 NOTE — Op Note (Signed)
Patient:  Wayne Munoz  DOB:  19-Jun-1956  MRN:  [id number]   Preop Diagnosis:  Biliary colic, cholelithiasis   Postop Diagnosis:  Same  Procedure:  Robotic assisted laparoscopic cholecystectomy   Surgeon:  Franky Macho, MD   Anes:  GET  Indications:  Patient is a 67yo wm who presents with biliary colic secondary to cholelithiasis.  The risks and benefits of the procedure including bleeding, infection, hepatobiliary injury, and the possibility of an open procedure were fully explained to the patient, who gives informed consent.  Procedure note: Patient was placed in the supine position.  After induction of general endotracheal anesthesia, the abdomen was prepped and draped using the usual sterile technique with ChloraPrep.  Surgical site confirmation was performed.  An infraumbilical incision was made down to the fascia.  A Veress needle was introduced into the abdominal cavity and confirmation of placement was done using the saline drop test.  The abdomen was then insufflated to 15 mmHg pressure.  An 8 mm trocar was introduced into the abdominal cavity under direct visualization without difficulty.  The patient was placed in reverse Trendelenburg position and additional 8 mm trocars was placed in the left upper quadrant, right lower mid abdomen, and right lower flank regions.  The robot was then targeted and docked.  The liver was inspected and noted within normal limits.  The gallbladder was retracted in a dynamic fashion in order to provide a critical view of the triangle of Calot.  The cystic duct was first identified.  Its juncture to the infundibulum was fully identified.  This was confirmed with firefly.  Hem-o-lok clips were placed proximally distally on the cystic duct and the cystic duct was divided.  The cystic artery was likewise ligated and divided.  The gallbladder was freed away from the gallbladder fossa using Bovie electrocautery.  The gallbladder along with 2 stones were  removed using an Endo Catch bag without difficulty.  The gallbladder fossa was inspected a bleeding was controlled using Bovie electrocautery.  Surgicel powder was placed into the gallbladder fossa. The robot was undocked and all trocars were then removed after evacuation of air from the abdominal cavity.  All wounds were irrigated with normal saline.  All wounds were injected with 0.5% Sensorcaine.  All incisions were closed using a 4-0 Monocryl subcuticular suture.  Dermabond was applied.  All tape and needle counts were correct at the end of the procedure.  The patient was extubated in the operating room and transferred to PACU in stable condition.  Complications: None  EBL: Minimal  Specimen: Gallbladder

## 2023-03-19 NOTE — Interval H&P Note (Signed)
History and Physical Interval Note:  03/19/2023 7:13 AM  Wayne Munoz  has presented today for surgery, with the diagnosis of Cholelithiasis.  The various methods of treatment have been discussed with the patient and family. After consideration of risks, benefits and other options for treatment, the patient has consented to  Procedure(s): XI ROBOTIC ASSISTED LAPAROSCOPIC CHOLECYSTECTOMY (N/A) as a surgical intervention.  The patient's history has been reviewed, patient examined, no change in status, stable for surgery.  I have reviewed the patient's chart and labs.  Questions were answered to the patient's satisfaction.     Franky Macho

## 2023-03-19 NOTE — Transfer of Care (Signed)
Immediate Anesthesia Transfer of Care Note  Patient: Wayne Munoz  Procedure(s) Performed: XI ROBOTIC ASSISTED LAPAROSCOPIC CHOLECYSTECTOMY (Abdomen)  Patient Location: PACU  Anesthesia Type:General  Level of Consciousness: awake, drowsy, and patient cooperative  Airway & Oxygen Therapy: Patient Spontanous Breathing and Patient connected to face mask oxygen  Post-op Assessment: Report given to RN and Post -op Vital signs reviewed and stable  Post vital signs: Reviewed and stable  Last Vitals:  Vitals Value Taken Time  BP 161/84 03/19/23 0932  Temp 37 C 03/19/23 0930  Pulse 79 03/19/23 0940  Resp 21 03/19/23 0940  SpO2 100 % 03/19/23 0940  Vitals shown include unvalidated device data.  Last Pain:  Vitals:   03/19/23 0930  PainSc: 10-Worst pain ever         Complications: No notable events documented. Patient PIV dislodge on transport. Patient did not receive last dose of pain medication given. New PIV started in PACU by PACU RN. Pain medication administered by PACU RN. Report given to PACU RN.

## 2023-03-19 NOTE — Anesthesia Postprocedure Evaluation (Signed)
Anesthesia Post Note  Patient: Wayne Munoz  Procedure(s) Performed: XI ROBOTIC ASSISTED LAPAROSCOPIC CHOLECYSTECTOMY (Abdomen)  Patient location during evaluation: Phase II Anesthesia Type: General Level of consciousness: awake and alert and oriented Pain management: pain level controlled Vital Signs Assessment: post-procedure vital signs reviewed and stable Respiratory status: spontaneous breathing, nonlabored ventilation and respiratory function stable Cardiovascular status: blood pressure returned to baseline and stable Postop Assessment: no apparent nausea or vomiting Anesthetic complications: no  No notable events documented.   Last Vitals:  Vitals:   03/19/23 1015 03/19/23 1030  BP: 129/73 136/78  Pulse: 74 72  Resp: 14 15  Temp:  36.6 C  SpO2: 93% 98%    Last Pain:  Vitals:   03/19/23 1030  TempSrc: Oral  PainSc: 4                   C 

## 2023-03-19 NOTE — Anesthesia Preprocedure Evaluation (Signed)
Anesthesia Evaluation  Patient identified by MRN, date of birth, ID band Patient awake    Reviewed: Allergy & Precautions, H&P , NPO status , Patient's Chart, lab work & pertinent test results  Airway Mallampati: II  TM Distance: >3 FB Neck ROM: Full    Dental  (+) Dental Advisory Given, Missing   Pulmonary neg sleep apnea (snoring)   Pulmonary exam normal breath sounds clear to auscultation       Cardiovascular negative cardio ROS Normal cardiovascular exam Rhythm:Regular Rate:Normal     Neuro/Psych negative neurological ROS  negative psych ROS   GI/Hepatic negative GI ROS, Neg liver ROS,,,  Endo/Other  negative endocrine ROS    Renal/GU negative Renal ROS  negative genitourinary   Musculoskeletal negative musculoskeletal ROS (+)    Abdominal   Peds negative pediatric ROS (+)  Hematology negative hematology ROS (+)   Anesthesia Other Findings   Reproductive/Obstetrics negative OB ROS                             Anesthesia Physical Anesthesia Plan  ASA: 2  Anesthesia Plan: General   Post-op Pain Management: Dilaudid IV   Induction: Intravenous  PONV Risk Score and Plan: 4 or greater and Ondansetron, Dexamethasone and Midazolam  Airway Management Planned: Oral ETT  Additional Equipment:   Intra-op Plan:   Post-operative Plan: Extubation in OR  Informed Consent: I have reviewed the patients History and Physical, chart, labs and discussed the procedure including the risks, benefits and alternatives for the proposed anesthesia with the patient or authorized representative who has indicated his/her understanding and acceptance.     Dental advisory given  Plan Discussed with: CRNA and Surgeon  Anesthesia Plan Comments:        Anesthesia Quick Evaluation

## 2023-03-22 LAB — SURGICAL PATHOLOGY

## 2023-03-25 ENCOUNTER — Encounter: Payer: BC Managed Care – PPO | Admitting: General Surgery

## 2023-03-26 ENCOUNTER — Telehealth (INDEPENDENT_AMBULATORY_CARE_PROVIDER_SITE_OTHER): Payer: BC Managed Care – PPO | Admitting: General Surgery

## 2023-03-26 DIAGNOSIS — Z09 Encounter for follow-up examination after completed treatment for conditions other than malignant neoplasm: Secondary | ICD-10-CM

## 2023-03-26 NOTE — Telephone Encounter (Signed)
Left message

## 2023-04-07 ENCOUNTER — Encounter: Payer: BC Managed Care – PPO | Admitting: Family Medicine

## 2023-05-25 DIAGNOSIS — Z79899 Other long term (current) drug therapy: Secondary | ICD-10-CM | POA: Diagnosis not present

## 2023-06-09 DIAGNOSIS — L570 Actinic keratosis: Secondary | ICD-10-CM | POA: Diagnosis not present

## 2023-06-09 DIAGNOSIS — L400 Psoriasis vulgaris: Secondary | ICD-10-CM | POA: Diagnosis not present

## 2023-06-09 DIAGNOSIS — D225 Melanocytic nevi of trunk: Secondary | ICD-10-CM | POA: Diagnosis not present

## 2023-06-09 DIAGNOSIS — D235 Other benign neoplasm of skin of trunk: Secondary | ICD-10-CM | POA: Diagnosis not present

## 2023-06-09 DIAGNOSIS — Z79899 Other long term (current) drug therapy: Secondary | ICD-10-CM | POA: Diagnosis not present

## 2023-06-30 ENCOUNTER — Encounter: Payer: Self-pay | Admitting: Family Medicine

## 2023-06-30 ENCOUNTER — Ambulatory Visit: Payer: BC Managed Care – PPO | Admitting: Family Medicine

## 2023-06-30 VITALS — BP 139/70 | HR 60 | Temp 98.3°F | Ht 72.0 in | Wt 270.2 lb

## 2023-06-30 DIAGNOSIS — G8929 Other chronic pain: Secondary | ICD-10-CM | POA: Diagnosis not present

## 2023-06-30 DIAGNOSIS — M25511 Pain in right shoulder: Secondary | ICD-10-CM | POA: Diagnosis not present

## 2023-06-30 NOTE — Progress Notes (Signed)
   Acute Office Visit  Subjective:     Patient ID: Wayne Munoz, male    DOB: Aug 19, 1956, 67 y.o.   MRN: [id number]  Chief Complaint  Patient presents with   Shoulder Pain    Shoulder Pain  The pain is present in the right shoulder. This is a new problem. Episode onset: months. There has been no history of extremity trauma. The problem occurs intermittently. The problem has been unchanged. The quality of the pain is described as aching and burning. Associated symptoms include stiffness. Pertinent negatives include no inability to bear weight, joint locking, joint swelling, limited range of motion, numbness or tingling. The symptoms are aggravated by activity and lying down. He has tried acetaminophen, OTC ointments and NSAIDS for the symptoms. The treatment provided mild relief.     Review of Systems  Musculoskeletal:  Positive for stiffness.  Neurological:  Negative for tingling and numbness.        Objective:    BP 139/70   Pulse 60   Temp 98.3 F (36.8 C) (Temporal)   Ht 6' (1.829 m)   Wt 270 lb 4 oz (122.6 kg)   BMI 36.65 kg/m    Physical Exam Vitals and nursing note reviewed.  Constitutional:      General: He is not in acute distress.    Appearance: He is not ill-appearing, toxic-appearing or diaphoretic.  Pulmonary:     Effort: Pulmonary effort is normal. No respiratory distress.  Musculoskeletal:     Right shoulder: Tenderness (anterior and posterior) present. No swelling, deformity, effusion or bony tenderness. Normal range of motion. Normal strength.     Cervical back: Neck supple. No rigidity or tenderness.  Skin:    General: Skin is warm and dry.  Neurological:     General: No focal deficit present.     Mental Status: He is alert and oriented to person, place, and time.  Psychiatric:        Mood and Affect: Mood normal.        Behavior: Behavior normal.     No results found for any visits on 06/30/23.      Assessment & Plan:   Bingham was  seen today for shoulder pain.  Diagnoses and all orders for this visit:  Chronic right shoulder pain Declines xray today. He would like to try an injection and this resolved pain in his left shoulder previously. We have gotten him schedule with [facility] tomorrow for injection.   The patient indicates understanding of these issues and agrees with the plan.  Gabriel Earing, FNP

## 2023-07-01 ENCOUNTER — Ambulatory Visit: Payer: BC Managed Care – PPO | Admitting: Family Medicine

## 2023-07-01 ENCOUNTER — Encounter: Payer: Self-pay | Admitting: Family Medicine

## 2023-07-01 VITALS — BP 158/77 | HR 64 | Temp 97.8°F | Ht 73.0 in | Wt 268.8 lb

## 2023-07-01 DIAGNOSIS — G8929 Other chronic pain: Secondary | ICD-10-CM | POA: Diagnosis not present

## 2023-07-01 DIAGNOSIS — M25511 Pain in right shoulder: Secondary | ICD-10-CM

## 2023-07-01 MED ORDER — METHYLPREDNISOLONE ACETATE 40 MG/ML IJ SUSP
40.0000 mg | Freq: Once | INTRAMUSCULAR | Status: AC
Start: 2023-07-01 — End: 2023-07-01
  Administered 2023-07-01: 60 mg via INTRAMUSCULAR

## 2023-07-01 NOTE — Progress Notes (Signed)
Subjective:  Patient ID: Wayne Munoz, male    DOB: [birth year], 67 y.o.   MRN: [id number]  Patient Care Team: Raliegh Ip, DO as PCP - General (Family Medicine)   Chief Complaint:  shoulder injection  (right)   HPI: Wayne Munoz is a 67 y.o. male presenting on 07/01/2023 for shoulder injection  (right)   Shoulder Pain  The pain is present in the right shoulder. This is a chronic problem. The current episode started more than 1 month ago. There has been no history of extremity trauma. The problem occurs constantly. The problem has been waxing and waning. The quality of the pain is described as aching and sharp. The pain is moderate. Associated symptoms include joint swelling, a limited range of motion and stiffness. Pertinent negatives include no fever, inability to bear weight, itching, joint locking, numbness or tingling. The symptoms are aggravated by activity and lying down. He has tried acetaminophen and movement for the symptoms. The treatment provided mild relief.      Relevant past medical, surgical, family, and social history reviewed and updated as indicated.  Allergies and medications reviewed and updated. Data reviewed: Chart in Epic.   Past Medical History:  Diagnosis Date   Psoriasis     Past Surgical History:  Procedure Laterality Date   KNEE ARTHROSCOPY Right     Social History   Socioeconomic History   Marital status: Married    Spouse name: Not on file   Number of children: Not on file   Years of education: Not on file   Highest education level: Not on file  Occupational History   Occupation: Retired  Tobacco Use   Smoking status: Never   Smokeless tobacco: Never  Vaping Use   Vaping status: Never Used  Substance and Sexual Activity   Alcohol use: No    Alcohol/week: 0.0 standard drinks of alcohol   Drug use: No   Sexual activity: Not on file  Other Topics Concern   Not on file  Social History Narrative   Wife, Misty Stanley   He  is retired from [employer]   Social Determinants of Corporate investment banker Strain: Not on [employer] Insecurity: Not on file  Transportation Needs: Not on file  Physical Activity: Not on file  Stress: Not on file  Social Connections: Not on file  Intimate Partner Violence: Not on file    Outpatient Encounter Medications as of 07/01/2023  Medication Sig   Naphazoline HCl (CLEAR EYES OP) Place 1 drop into both eyes 3 (three) times a week.   tamsulosin (FLOMAX) 0.4 MG CAPS capsule Take 1 capsule (0.4 mg total) by mouth daily.   TREMFYA 100 MG/ML SOPN Inject 100 mg into the skin every 8 (eight) weeks.   No facility-administered encounter medications on file as of 07/01/2023.    No Known Allergies  Review of Systems  Constitutional:  Negative for fever.  Musculoskeletal:  Positive for arthralgias, joint swelling and stiffness.  Skin:  Negative for itching.  Neurological:  Negative for tingling and numbness.  All other systems reviewed and are negative.       Objective:  BP (!) 158/77   Pulse 64   Temp 97.8 F (36.6 C) (Temporal)   Ht 6\' 1"  (1.854 m)   Wt 268 lb 12.8 oz (121.9 kg)   SpO2 96%   BMI 35.46 kg/m    Wt Readings from Last 3 Encounters:  07/01/23 268 lb 12.8 oz (121.9 kg)  06/30/23 270 lb 4 oz (122.6 kg)  03/19/23 266 lb 15.6 oz (121.1 kg)    Physical Exam Vitals and nursing note reviewed.  Constitutional:      General: He is not in acute distress.    Appearance: Normal appearance. He is obese. He is not ill-appearing, toxic-appearing or diaphoretic.  HENT:     Head: Normocephalic and atraumatic.     Nose: Nose normal.     Mouth/Throat:     Mouth: Mucous membranes are moist.     Pharynx: Oropharynx is clear.  Eyes:     Conjunctiva/sclera: Conjunctivae normal.     Pupils: Pupils are equal, round, and reactive to light.  Cardiovascular:     Rate and Rhythm: Normal rate and regular rhythm.     Pulses: Normal pulses.  Pulmonary:     Effort:  Pulmonary effort is normal.     Breath sounds: Normal breath sounds.  Musculoskeletal:     Right shoulder: Tenderness present. No swelling, deformity, effusion, laceration, bony tenderness or crepitus. Decreased range of motion. Normal strength. Normal pulse.     Right upper arm: Normal.     Cervical back: Normal.     Right lower leg: No edema.     Left lower leg: No edema.  Skin:    General: Skin is warm and dry.     Capillary Refill: Capillary refill takes less than 2 seconds.  Neurological:     General: No focal deficit present.     Mental Status: He is alert and oriented to person, place, and time.  Psychiatric:        Mood and Affect: Mood normal.        Behavior: Behavior normal.        Thought Content: Thought content normal.        Judgment: Judgment normal.     Results for orders placed or performed during the hospital encounter of 03/19/23  Surgical pathology  Result Value Ref Range   SURGICAL PATHOLOGY      SURGICAL PATHOLOGY CASE: [id number] PATIENT: Wayne Munoz Surgical Pathology Report     Clinical History: Cholelithiasis ([place])    FINAL MICROSCOPIC DIAGNOSIS:  A. GALLBLADDER, CHOLECYSTECTOMY: Mild chronic cholecystitis.   GROSS DESCRIPTION:  A. Specimen: received fresh and subsequently placed in formalin labeled with the patient's name and "Gallbladder" is a gallbladder. Integrity/Size: largely disrupted in the liver bed, 6.6 x up to 3.5 cm Serosal surface: tan, glistening, and wrinkled. Mucosa/Wall: a 0.2-0.5 cm thick wall surrounds green to amber, mildly trabeculated mucosa. Contents: a scant amount of green, viscous bile. No calculi are received, either in the body or in the container. Cystic duct: received patent and 0.5 cm in diameter.  An adjacent lymph node is not grossly identified. Block Summary: 1 block (LEF 03/19/2023)   Final Diagnosis performed by Jimmy Picket, MD.   Electronically signed 03/22/2023 Technical component  performed a t [facility], [address], [place], [place] [address].  Professional component performed at [employer]. [facility], [address] [place], [place] [address].  Immunohistochemistry Technical component (if applicable) was performed at [facility]. [address], [address], [place], [place] [address].   IMMUNOHISTOCHEMISTRY DISCLAIMER (if applicable): Some of these immunohistochemical stains may have been developed and the performance characteristics determine by [facility]. Some may not have been cleared or approved by the U.S. Food and Drug Administration. The FDA has determined that such clearance or approval is not necessary. This  test is used for clinical purposes. It should not be regarded as investigational or for research. This laboratory is certified under the Clinical Laboratory Improvement Amendments of 1988 (CLIA-88) as qualified to perform high complexity clinical laboratory testing.  The controls  stained appropriately.      Joint Injection/Arthrocentesis  Date/Time: 07/01/2023 8:12 AM  Performed by: Sonny Masters, FNP Authorized by: Sonny Masters, FNP  Indications: joint swelling and pain  Body area: shoulder Joint: right shoulder Local anesthesia used: yes  Anesthesia: Local anesthesia used: yes Local Anesthetic: co-phenylcaine spray  Sedation: Patient sedated: no  Preparation: Patient was prepped and draped in the usual sterile fashion. Needle size: 22 G Ultrasound guidance: no Approach: posterior Aspirate amount: 0 mL Methylprednisolone amount: 60 mg Lidocaine 2% amount: 3.5 mL Patient tolerance: patient tolerated the procedure well with no immediate complications      Pertinent labs & imaging results that were available during my care of the patient were reviewed by me and considered in my medical decision making.  Assessment & Plan:  Raul was seen today for shoulder  injection .  Diagnoses and all orders for this visit:  Chronic right shoulder pain Joint injection today, tolerated well. Symptomatic care discussed in detail. Report new, worsening, or persistent symptoms.  -     Joint Injection/Arthrocentesis     Continue all other maintenance medications.  Follow up plan: Return if symptoms worsen or fail to improve.   Continue healthy lifestyle choices, including diet (rich in fruits, vegetables, and lean proteins, and low in salt and simple carbohydrates) and exercise (at least 30 minutes of moderate physical activity daily).  Educational handout given for joint injection  The above assessment and management plan was discussed with the patient. The patient verbalized understanding of and has agreed to the management plan. Patient is aware to call the clinic if they develop any new symptoms or if symptoms persist or worsen. Patient is aware when to return to the clinic for a follow-up visit. Patient educated on when it is appropriate to go to the emergency department.   Kari Baars, FNP-C [facility] [phone]

## 2023-07-01 NOTE — Addendum Note (Signed)
Addended by: Angela Adam on: 07/01/2023 08:32 AM   Modules accepted: Orders

## 2023-09-09 ENCOUNTER — Ambulatory Visit: Payer: BC Managed Care – PPO | Admitting: Urology

## 2023-09-24 ENCOUNTER — Other Ambulatory Visit: Payer: Self-pay | Admitting: Nurse Practitioner

## 2023-09-29 DIAGNOSIS — M546 Pain in thoracic spine: Secondary | ICD-10-CM | POA: Diagnosis not present

## 2023-09-29 DIAGNOSIS — M9902 Segmental and somatic dysfunction of thoracic region: Secondary | ICD-10-CM | POA: Diagnosis not present

## 2023-09-29 DIAGNOSIS — M62838 Other muscle spasm: Secondary | ICD-10-CM | POA: Diagnosis not present

## 2023-09-29 DIAGNOSIS — M9901 Segmental and somatic dysfunction of cervical region: Secondary | ICD-10-CM | POA: Diagnosis not present

## 2023-09-29 DIAGNOSIS — M50121 Cervical disc disorder at C4-C5 level with radiculopathy: Secondary | ICD-10-CM | POA: Diagnosis not present

## 2023-09-29 DIAGNOSIS — M9903 Segmental and somatic dysfunction of lumbar region: Secondary | ICD-10-CM | POA: Diagnosis not present

## 2023-10-01 DIAGNOSIS — M5134 Other intervertebral disc degeneration, thoracic region: Secondary | ICD-10-CM | POA: Diagnosis not present

## 2023-10-01 DIAGNOSIS — M47812 Spondylosis without myelopathy or radiculopathy, cervical region: Secondary | ICD-10-CM | POA: Diagnosis not present

## 2023-10-01 DIAGNOSIS — M47816 Spondylosis without myelopathy or radiculopathy, lumbar region: Secondary | ICD-10-CM | POA: Diagnosis not present

## 2023-10-01 DIAGNOSIS — M19011 Primary osteoarthritis, right shoulder: Secondary | ICD-10-CM | POA: Diagnosis not present

## 2023-10-02 DIAGNOSIS — M9901 Segmental and somatic dysfunction of cervical region: Secondary | ICD-10-CM | POA: Diagnosis not present

## 2023-10-02 DIAGNOSIS — M25511 Pain in right shoulder: Secondary | ICD-10-CM | POA: Diagnosis not present

## 2023-10-02 DIAGNOSIS — M9907 Segmental and somatic dysfunction of upper extremity: Secondary | ICD-10-CM | POA: Diagnosis not present

## 2023-10-02 DIAGNOSIS — M9902 Segmental and somatic dysfunction of thoracic region: Secondary | ICD-10-CM | POA: Diagnosis not present

## 2023-10-02 DIAGNOSIS — M546 Pain in thoracic spine: Secondary | ICD-10-CM | POA: Diagnosis not present

## 2023-10-02 DIAGNOSIS — M9903 Segmental and somatic dysfunction of lumbar region: Secondary | ICD-10-CM | POA: Diagnosis not present

## 2023-10-12 DIAGNOSIS — M9901 Segmental and somatic dysfunction of cervical region: Secondary | ICD-10-CM | POA: Diagnosis not present

## 2023-10-12 DIAGNOSIS — M9907 Segmental and somatic dysfunction of upper extremity: Secondary | ICD-10-CM | POA: Diagnosis not present

## 2023-10-12 DIAGNOSIS — M25511 Pain in right shoulder: Secondary | ICD-10-CM | POA: Diagnosis not present

## 2023-10-12 DIAGNOSIS — M9903 Segmental and somatic dysfunction of lumbar region: Secondary | ICD-10-CM | POA: Diagnosis not present

## 2023-10-12 DIAGNOSIS — M9902 Segmental and somatic dysfunction of thoracic region: Secondary | ICD-10-CM | POA: Diagnosis not present

## 2023-10-12 DIAGNOSIS — M546 Pain in thoracic spine: Secondary | ICD-10-CM | POA: Diagnosis not present

## 2023-10-13 DIAGNOSIS — M546 Pain in thoracic spine: Secondary | ICD-10-CM | POA: Diagnosis not present

## 2023-10-13 DIAGNOSIS — M9901 Segmental and somatic dysfunction of cervical region: Secondary | ICD-10-CM | POA: Diagnosis not present

## 2023-10-13 DIAGNOSIS — M9907 Segmental and somatic dysfunction of upper extremity: Secondary | ICD-10-CM | POA: Diagnosis not present

## 2023-10-13 DIAGNOSIS — M9903 Segmental and somatic dysfunction of lumbar region: Secondary | ICD-10-CM | POA: Diagnosis not present

## 2023-10-13 DIAGNOSIS — M25511 Pain in right shoulder: Secondary | ICD-10-CM | POA: Diagnosis not present

## 2023-10-13 DIAGNOSIS — M9902 Segmental and somatic dysfunction of thoracic region: Secondary | ICD-10-CM | POA: Diagnosis not present

## 2023-10-15 DIAGNOSIS — M546 Pain in thoracic spine: Secondary | ICD-10-CM | POA: Diagnosis not present

## 2023-10-15 DIAGNOSIS — M9903 Segmental and somatic dysfunction of lumbar region: Secondary | ICD-10-CM | POA: Diagnosis not present

## 2023-10-15 DIAGNOSIS — M9907 Segmental and somatic dysfunction of upper extremity: Secondary | ICD-10-CM | POA: Diagnosis not present

## 2023-10-15 DIAGNOSIS — M9901 Segmental and somatic dysfunction of cervical region: Secondary | ICD-10-CM | POA: Diagnosis not present

## 2023-10-15 DIAGNOSIS — M25511 Pain in right shoulder: Secondary | ICD-10-CM | POA: Diagnosis not present

## 2023-10-15 DIAGNOSIS — M9902 Segmental and somatic dysfunction of thoracic region: Secondary | ICD-10-CM | POA: Diagnosis not present

## 2023-10-19 ENCOUNTER — Ambulatory Visit: Payer: BC Managed Care – PPO | Admitting: Nurse Practitioner

## 2023-10-19 ENCOUNTER — Encounter: Payer: Self-pay | Admitting: Nurse Practitioner

## 2023-10-19 VITALS — BP 142/79 | HR 70 | Temp 97.2°F | Ht 73.0 in | Wt 271.0 lb

## 2023-10-19 DIAGNOSIS — M25511 Pain in right shoulder: Secondary | ICD-10-CM | POA: Diagnosis not present

## 2023-10-19 MED ORDER — METHYLPREDNISOLONE ACETATE 40 MG/ML IJ SUSP
40.0000 mg | Freq: Once | INTRAMUSCULAR | Status: AC
Start: 2023-10-19 — End: 2023-10-19
  Administered 2023-10-19: 40 mg via INTRAMUSCULAR

## 2023-10-19 MED ORDER — BUPIVACAINE HCL 0.25 % IJ SOLN
1.0000 mL | Freq: Once | INTRAMUSCULAR | Status: AC
Start: 2023-10-19 — End: 2023-10-19
  Administered 2023-10-19: 1 mL via INTRA_ARTICULAR

## 2023-10-19 NOTE — Patient Instructions (Signed)
Joint Steroid Injection A joint steroid injection is a procedure to relieve swelling and pain in a joint. Steroids are medicines that reduce inflammation. In this procedure, your health care provider uses a syringe and a needle to inject a steroid medicine into a painful and inflamed joint. A pain-relieving medicine (anesthetic) may be injected along with the steroid. In some cases, your health care provider may use an imaging technique such as ultrasound or fluoroscopy to guide the injection. Joints that are often treated with steroid injections include the knee, shoulder, hip, and spine. These injections may also be used in the elbow, ankle, and joints of the hands or feet. You may have joint steroid injections as part of your treatment for inflammation caused by: Gout. Rheumatoid arthritis. Advanced wear-and-tear arthritis (osteoarthritis). Tendinitis. Bursitis. Joint steroid injections may be repeated, but having them too often can damage a joint or the skin over the joint. You should not have joint steroid injections less than 6 weeks apart or more than four times a year. Tell a health care provider about: Any allergies you have. All medicines you are taking, including vitamins, herbs, eye drops, creams, and over-the-counter medicines. Any problems you or family members have had with anesthetic medicines. Any blood disorders you have. Any surgeries you have had. Any medical conditions you have. Whether you are pregnant or may be pregnant. What are the risks? Generally, this is a safe treatment. However, problems may occur, including: Infection. Bleeding. Allergic reactions to medicines. Damage to the joint or tissues around the joint. Thinning of skin or loss of skin color over the joint. Temporary flushing of the face or chest. Temporary increase in pain. Temporary increase in blood sugar. Failure to relieve inflammation or pain. What happens before the treatment? Medicines Ask  your health care provider about: Changing or stopping your regular medicines. This is especially important if you are taking diabetes medicines or blood thinners. Taking medicines such as aspirin and ibuprofen. These medicines can thin your blood. Do not take these medicines unless your health care provider tells you to take them. Taking over-the-counter medicines, vitamins, herbs, and supplements. General instructions You may have imaging tests of your joint. Ask your health care provider if you can drive yourself home after the procedure. What happens during the treatment?  Your health care provider will position you for the injection and locate the injection site over your joint. The skin over the joint will be cleaned with a germ-killing soap. Your health care provider may: Spray a numbing solution (topical anesthetic) over the injection site. Inject a local anesthetic under the skin above your joint. The needle will be placed through your skin into your joint. Your health care provider may use imaging to guide the needle to the right spot for the injection. If imaging is used, a special contrast dye may be injected to confirm that the needle is in the correct location. The steroid medicine will be injected into your joint. Anesthetic may be injected along with the steroid. This may be a medicine that relieves pain for a short time (short-acting anesthetic) or for a longer time (long-acting anesthetic). The needle will be removed, and an adhesive bandage (dressing) will be placed over the injection site. The procedure may vary among health care providers and hospitals. What can I expect after the treatment? You will be able to go home after the treatment. It is normal to feel slight flushing for a few days after the injection. After the treatment, it is  common to have an increase in joint pain after the anesthetic has worn off. This may happen about an hour after a short-acting anesthetic  or about 8 hours after a longer-acting anesthetic. You should begin to feel relief from joint pain and swelling after 24 to 48 hours. Contact your health care provider if you do not begin to feel relief after 2 days. Follow these instructions at home: Injection site care Leave the adhesive dressing over your injection site in place until your health care provider says you can remove it. Check your injection site every day for signs of infection. Check for: More redness, swelling, or pain. Fluid or blood. Warmth. Pus or a bad smell. Activity Return to your normal activities as told by your health care provider. Ask your health care provider what activities are safe for you. You may be asked to limit activities that put stress on the joint for a few days. Do joint exercises as told by your health care provider. Do not take baths, swim, or use a hot tub until your health care provider approves. Ask your health care provider if you may take showers. You may only be allowed to take sponge baths. Managing pain, stiffness, and swelling  If directed, put ice on the joint. To do this: Put ice in a plastic bag. Place a towel between your skin and the bag. Leave the ice on for 20 minutes, 2-3 times a day. Remove the ice if your skin turns bright red. This is very important. If you cannot feel pain, heat, or cold, you have a greater risk of damage to the area. Raise (elevate) your joint above the level of your heart when you are sitting or lying down. General instructions Take over-the-counter and prescription medicines only as told by your health care provider. Do not use any products that contain nicotine or tobacco, such as cigarettes, e-cigarettes, and chewing tobacco. These can delay joint healing. If you need help quitting, ask your health care provider. If you have diabetes, be aware that your blood sugar may be slightly elevated for several days after the injection. Keep all follow-up visits.  This is important. Contact a health care provider if you have: Chills or a fever. Any signs of infection at your injection site. Increased pain or swelling or no relief after 2 days. Summary A joint steroid injection is a treatment to relieve pain and swelling in a joint. Steroids are medicines that reduce inflammation. Your health care provider may add an anesthetic along with the steroid. You may have joint steroid injections as part of your arthritis treatment. Joint steroid injections may be repeated, but having them too often can damage a joint or the skin over the joint. Contact your health care provider if you have a fever, chills, or signs of infection, or if you get no relief from joint pain or swelling. This information is not intended to replace advice given to you by your health care provider. Make sure you discuss any questions you have with your health care provider. Document Revised: 03/29/2020 Document Reviewed: 03/29/2020 Elsevier Patient Education  2024 [employer].

## 2023-10-19 NOTE — Progress Notes (Signed)
Subjective:    Patient ID: Wayne Munoz, male    DOB: [birth year], 67 y.o.   MRN: [id number]   Chief Complaint: Shoulder Pain (Right side/Would like a cortisone shot)   Patient in c/o recurrent right shoulder pain. He had same issue 5 months ago and had cortisine injection which helped. Was told he has arthritis.  Shoulder Pain  The pain is present in the right shoulder. This is a recurrent problem. The current episode started in the past 7 days. There has been no history of extremity trauma. The problem occurs intermittently. The problem has been waxing and waning. The quality of the pain is described as aching. The pain is at a severity of 2/10. The pain is mild. Associated symptoms include a limited range of motion. Exacerbated by: movement in certain directions. The treatment provided mild relief.    Patient Active Problem List   Diagnosis Date Noted   Calculus of gallbladder without cholecystitis without obstruction 03/19/2023   Lower urinary tract symptoms (LUTS) 03/10/2023   Chronic left shoulder pain 12/24/2021   Elevated serum glucose 12/24/2021   Morbid obesity (HCC) 12/24/2021   Mixed hyperlipidemia 12/24/2021       Review of Systems  Constitutional:  Negative for diaphoresis.  Eyes:  Negative for pain.  Respiratory:  Negative for shortness of breath.   Cardiovascular:  Negative for chest pain, palpitations and leg swelling.  Gastrointestinal:  Negative for abdominal pain.  Endocrine: Negative for polydipsia.  Musculoskeletal:  Positive for joint swelling (right shoulder).  Skin:  Negative for rash.  Neurological:  Negative for dizziness, weakness and headaches.  Hematological:  Does not bruise/bleed easily.  All other systems reviewed and are negative.      Objective:   Physical Exam Constitutional:      Appearance: Normal appearance.  Cardiovascular:     Rate and Rhythm: Normal rate and regular rhythm.     Heart sounds: Normal heart sounds.   Pulmonary:     Effort: Pulmonary effort is normal.     Breath sounds: Normal breath sounds.  Musculoskeletal:     Comments: decrease ROM of right shoulder with pain on external and internal rotation  Skin:    General: Skin is warm.  Neurological:     General: No focal deficit present.     Mental Status: He is alert and oriented to person, place, and time.     BP (!) 142/79   Pulse 70   Temp (!) 97.2 F (36.2 C) (Temporal)   Ht 6\' 1"  (1.854 m)   Wt 271 lb (122.9 kg)   SpO2 92%   BMI 35.75 kg/m   Joint Injection/Arthrocentesis  Date/Time: 10/19/2023 3:36 PM  Performed by: Bennie Pierini, FNP Authorized by: Daphine Deutscher Mary-Margaret, FNP  Indications: pain  Body area: shoulder Joint: right shoulder Local anesthesia used: no  Anesthesia: Local anesthesia used: no  Sedation: Patient sedated: no  Preparation: Patient was prepped and draped in the usual sterile fashion. Needle size: 22 G Ultrasound guidance: no Approach: posterior Methylprednisolone amount: 40 mg Bupivacaine 0.25% amount: 1 mL Patient tolerance: patient tolerated the procedure well with no immediate complications         Assessment & Plan:   Wayne Munoz in today with chief complaint of Shoulder Pain (Right side/Would like a cortisone shot)   1. Acute pain of right shoulder (Primary) Ice Rest  RTO prn - Joint Injection/Arthrocentesis - methylPREDNISolone acetate (DEPO-MEDROL) injection 40 mg - bupivacaine (MARCAINE) 0.25 % (with pres) injection 1  mL    The above assessment and management plan was discussed with the patient. The patient verbalized understanding of and has agreed to the management plan. Patient is aware to call the clinic if symptoms persist or worsen. Patient is aware when to return to the clinic for a follow-up visit. Patient educated on when it is appropriate to go to the emergency department.   [name], FNP

## 2023-10-29 ENCOUNTER — Other Ambulatory Visit: Payer: Self-pay

## 2023-10-29 DIAGNOSIS — R7303 Prediabetes: Secondary | ICD-10-CM

## 2023-10-29 DIAGNOSIS — Z130 Encounter for screening for diseases of the blood and blood-forming organs and certain disorders involving the immune mechanism: Secondary | ICD-10-CM

## 2023-10-29 DIAGNOSIS — E782 Mixed hyperlipidemia: Secondary | ICD-10-CM

## 2023-10-29 DIAGNOSIS — Z0000 Encounter for general adult medical examination without abnormal findings: Secondary | ICD-10-CM

## 2023-10-29 DIAGNOSIS — Z125 Encounter for screening for malignant neoplasm of prostate: Secondary | ICD-10-CM

## 2023-12-23 ENCOUNTER — Other Ambulatory Visit: Payer: Self-pay | Admitting: Urology

## 2024-01-07 ENCOUNTER — Encounter: Payer: Self-pay | Admitting: Family Medicine

## 2024-02-01 DIAGNOSIS — D225 Melanocytic nevi of trunk: Secondary | ICD-10-CM | POA: Diagnosis not present

## 2024-02-01 DIAGNOSIS — D235 Other benign neoplasm of skin of trunk: Secondary | ICD-10-CM | POA: Diagnosis not present

## 2024-02-01 DIAGNOSIS — L814 Other melanin hyperpigmentation: Secondary | ICD-10-CM | POA: Diagnosis not present

## 2024-02-01 DIAGNOSIS — L570 Actinic keratosis: Secondary | ICD-10-CM | POA: Diagnosis not present

## 2024-04-04 ENCOUNTER — Encounter: Payer: BC Managed Care – PPO | Admitting: Nurse Practitioner

## 2024-05-11 DIAGNOSIS — Z79899 Other long term (current) drug therapy: Secondary | ICD-10-CM | POA: Diagnosis not present

## 2024-05-15 ENCOUNTER — Other Ambulatory Visit

## 2024-05-15 DIAGNOSIS — R7303 Prediabetes: Secondary | ICD-10-CM | POA: Diagnosis not present

## 2024-05-15 DIAGNOSIS — Z125 Encounter for screening for malignant neoplasm of prostate: Secondary | ICD-10-CM

## 2024-05-15 DIAGNOSIS — E782 Mixed hyperlipidemia: Secondary | ICD-10-CM

## 2024-05-15 DIAGNOSIS — Z130 Encounter for screening for diseases of the blood and blood-forming organs and certain disorders involving the immune mechanism: Secondary | ICD-10-CM | POA: Diagnosis not present

## 2024-05-15 LAB — BAYER DCA HB A1C WAIVED: HB A1C (BAYER DCA - WAIVED): 6.3 % — ABNORMAL HIGH (ref 4.8–5.6)

## 2024-05-16 ENCOUNTER — Ambulatory Visit: Payer: Self-pay | Admitting: Family Medicine

## 2024-05-16 LAB — CBC WITH DIFFERENTIAL/PLATELET
Basophils Absolute: 0 x10E3/uL (ref 0.0–0.2)
Basos: 1 %
EOS (ABSOLUTE): 0.1 x10E3/uL (ref 0.0–0.4)
Eos: 3 %
Hematocrit: 46.3 % (ref 37.5–51.0)
Hemoglobin: 15.2 g/dL (ref 13.0–17.7)
Immature Grans (Abs): 0 x10E3/uL (ref 0.0–0.1)
Immature Granulocytes: 0 %
Lymphocytes Absolute: 1.2 x10E3/uL (ref 0.7–3.1)
Lymphs: 23 %
MCH: 29.7 pg (ref 26.6–33.0)
MCHC: 32.8 g/dL (ref 31.5–35.7)
MCV: 91 fL (ref 79–97)
Monocytes Absolute: 0.5 x10E3/uL (ref 0.1–0.9)
Monocytes: 9 %
Neutrophils Absolute: 3.4 x10E3/uL (ref 1.4–7.0)
Neutrophils: 64 %
Platelets: 166 x10E3/uL (ref 150–450)
RBC: 5.11 x10E6/uL (ref 4.14–5.80)
RDW: 13.2 % (ref 11.6–15.4)
WBC: 5.2 x10E3/uL (ref 3.4–10.8)

## 2024-05-16 LAB — CMP14+EGFR
ALT: 16 IU/L (ref 0–44)
AST: 16 IU/L (ref 0–40)
Albumin: 4.2 g/dL (ref 3.9–4.9)
Alkaline Phosphatase: 79 IU/L (ref 44–121)
BUN/Creatinine Ratio: 7 — ABNORMAL LOW (ref 10–24)
BUN: 9 mg/dL (ref 8–27)
Bilirubin Total: 0.9 mg/dL (ref 0.0–1.2)
CO2: 22 mmol/L (ref 20–29)
Calcium: 9.6 mg/dL (ref 8.6–10.2)
Chloride: 103 mmol/L (ref 96–106)
Creatinine, Ser: 1.21 mg/dL (ref 0.76–1.27)
Globulin, Total: 2.9 g/dL (ref 1.5–4.5)
Glucose: 140 mg/dL — ABNORMAL HIGH (ref 70–99)
Potassium: 5 mmol/L (ref 3.5–5.2)
Sodium: 139 mmol/L (ref 134–144)
Total Protein: 7.1 g/dL (ref 6.0–8.5)
eGFR: 66 mL/min/1.73 (ref >59)

## 2024-05-16 LAB — PSA, TOTAL AND FREE
PSA, Free Pct: 30
PSA, Free: 0.09 ng/mL
Prostate Specific Ag, Serum: 0.3 ng/mL (ref 0.0–4.0)

## 2024-05-16 LAB — LIPID PANEL
Chol/HDL Ratio: 3.4 ratio (ref 0.0–5.0)
Cholesterol, Total: 140 mg/dL (ref 100–199)
HDL: 41 mg/dL (ref >39)
LDL Chol Calc (NIH): 76 mg/dL (ref 0–99)
Triglycerides: 132 mg/dL (ref 0–149)
VLDL Cholesterol Cal: 23 mg/dL (ref 5–40)

## 2024-05-22 ENCOUNTER — Encounter: Payer: Self-pay | Admitting: Family Medicine

## 2024-05-22 ENCOUNTER — Ambulatory Visit: Admitting: Family Medicine

## 2024-05-22 VITALS — BP 129/58 | HR 74 | Temp 98.6°F | Ht 73.0 in | Wt 270.0 lb

## 2024-05-22 DIAGNOSIS — E782 Mixed hyperlipidemia: Secondary | ICD-10-CM | POA: Diagnosis not present

## 2024-05-22 DIAGNOSIS — Z0001 Encounter for general adult medical examination with abnormal findings: Secondary | ICD-10-CM | POA: Diagnosis not present

## 2024-05-22 DIAGNOSIS — R7303 Prediabetes: Secondary | ICD-10-CM

## 2024-05-22 DIAGNOSIS — Z0000 Encounter for general adult medical examination without abnormal findings: Secondary | ICD-10-CM

## 2024-05-22 DIAGNOSIS — R399 Unspecified symptoms and signs involving the genitourinary system: Secondary | ICD-10-CM

## 2024-05-22 NOTE — Progress Notes (Signed)
 Wayne Munoz is a 68 y.o. male presents to office today for annual physical exam examination.    Concerns today include: 1.  None  Occupation: Retired Corporate investment banker, Marital status: Married to [place], Substance use: None There are no preventive care reminders to display for this patient.  Refills needed today: None  Immunization History  Administered Date(s) Administered   Janssen (J&J) SARS-COV-2 Vaccination 01/14/2020   Tdap 03/10/2023   Past Medical History:  Diagnosis Date   Psoriasis    Social History   Socioeconomic History   Marital status: Married    Spouse name: Not on file   Number of children: Not on file   Years of education: Not on file   Highest education level: Not on file  Occupational History   Occupation: Retired  Tobacco Use   Smoking status: Never   Smokeless tobacco: Never  Vaping Use   Vaping status: Never Used  Substance and Sexual Activity   Alcohol use: No    Alcohol/week: 0.0 standard drinks of alcohol   Drug use: No   Sexual activity: Not on file  Other Topics Concern   Not on file  Social History Narrative   Wife, Olam   He is retired from Retail buyer   Social Drivers of Corporate investment banker Strain: Not on [employer] Insecurity: Not on file  Transportation Needs: Not on file  Physical Activity: Not on file  Stress: Not on file  Social Connections: Not on file  Intimate Partner Violence: Not on file   Past Surgical History:  Procedure Laterality Date   KNEE ARTHROSCOPY Right    History reviewed. No pertinent family history.  Current Outpatient Medications:    Naphazoline HCl (CLEAR EYES OP), Place 1 drop into both eyes 3 (three) times a week., Disp: , Rfl:    tamsulosin  (FLOMAX ) 0.4 MG CAPS capsule, TAKE 1 CAPSULE BY MOUTH EVERY DAY, Disp: 90 capsule, Rfl: 3   TREMFYA 100 MG/ML SOPN, Inject 100 mg into the skin every 8 (eight) weeks., Disp: , Rfl:   No Known Allergies   ROS: Review of Systems Pertinent  items noted in HPI and remainder of comprehensive ROS otherwise negative.    Physical exam BP (!) 129/58   Pulse 74   Temp 98.6 F (37 C)   Ht 6' 1 (1.854 m)   Wt 270 lb (122.5 kg)   SpO2 97%   BMI 35.62 kg/m  General appearance: alert, cooperative, appears stated age, no distress, and morbidly obese Head: Normocephalic, without obvious abnormality, atraumatic Eyes: negative findings: lids and lashes normal, conjunctivae and sclerae normal, corneas clear, and pupils equal, round, reactive to light and accomodation Ears: normal TM's and external ear canals both ears Nose: Nares normal. Septum midline. Mucosa normal. No drainage or sinus tenderness. Throat: lips, mucosa, and tongue normal; teeth and gums normal Neck: no adenopathy, no carotid bruit, supple, symmetrical, trachea midline, and thyroid  not enlarged, symmetric, no tenderness/mass/nodules Back: symmetric, no curvature. ROM normal. No CVA tenderness. Lungs: Mildly coarse in right lower lung initially but cleared.  He is otherwise clear to auscultation bilaterally Chest wall: no tenderness Heart: regular rate and rhythm, S1, S2 normal, no murmur, click, rub or gallop Abdomen: soft, non-tender; bowel sounds normal; no masses,  no organomegaly and obese Extremities: Trace pitting edema in bilateral lower extremities to mid shin Pulses: 2+ and symmetric Skin: Multiple areas of hypopigmentation noted along the arms and legs. Lymph nodes: Cervical, supraclavicular, and axillary nodes normal.  Neurologic: Grossly normal      05/22/2024    8:52 AM 06/30/2023   10:46 AM 03/10/2023    7:57 AM  Depression screen PHQ 2/9  Decreased Interest 0 0 0  Down, Depressed, Hopeless 0 0 0  PHQ - 2 Score 0 0 0  Altered sleeping 0 0 0  Tired, decreased energy 0 0 0  Change in appetite 0 0 0  Feeling bad or failure about yourself  0 0 0  Trouble concentrating 0 0 0  Moving slowly or fidgety/restless 0 0 0  Suicidal thoughts 0 0 0  PHQ-9  Score 0 0 0  Difficult doing work/chores Not difficult at all Not difficult at all Not difficult at all      05/22/2024    8:51 AM 06/30/2023   10:45 AM 03/10/2023    7:57 AM 10/27/2022    9:05 AM  GAD 7 : Generalized Anxiety Score  Nervous, Anxious, on Edge 0 0 0 0  Control/stop worrying 0 0 0 0  Worry too much - different things 0 0 0 0  Trouble relaxing 0 0 0 0  Restless 0 0 0 0  Easily annoyed or irritable 0 0 0 0  Afraid - awful might happen 0 0 0 0  Total GAD 7 Score 0 0 0 0  Anxiety Difficulty Not difficult at all Not difficult at all Not difficult at all Not difficult at all   Recent Results (from the past 2160 hours)  Bayer DCA Hb A1c Waived     Status: Abnormal   Collection Time: 05/15/24  8:56 AM  Result Value Ref Range   HB A1C (BAYER DCA - WAIVED) 6.3 (H) 4.8 - 5.6 %    Comment:          Prediabetes: 5.7 - 6.4          Diabetes: >6.4          Glycemic control for adults with diabetes: <7.0   PSA, total and free     Status: None   Collection Time: 05/15/24  8:58 AM  Result Value Ref Range   Prostate Specific Ag, Serum 0.3 0.0 - 4.0 ng/mL    Comment: Roche ECLIA methodology. According to the American Urological Association, Serum PSA should decrease and remain at undetectable levels after radical prostatectomy. The AUA defines biochemical recurrence as an initial PSA value 0.2 ng/mL or greater followed by a subsequent confirmatory PSA value 0.2 ng/mL or greater. Values obtained with different assay methods or kits cannot be used interchangeably. Results cannot be interpreted as absolute evidence of the presence or absence of malignant disease.    PSA, Free 0.09 N/A ng/mL    Comment: Roche ECLIA methodology.   PSA, Free Pct 30.0 %    Comment: The table below lists the probability of prostate cancer for men with non-suspicious DRE results and total PSA between 4 and 10 ng/mL, by patient age Wayne Munoz, JAMA 1998, [id number]).                   % Free PSA        50-64 yr        65-75 yr                   0.00-10.00%        56%             55%  10.01-15.00%        24%             35%                  15.01-20.00%        17%             23%                  20.01-25.00%        10%             20%                       >25.00%         5%              9% Please note:  Catalona et al did not make specific               recommendations regarding the use of               percent free PSA for any other population               of men.   Lipid panel     Status: None   Collection Time: 05/15/24  8:58 AM  Result Value Ref Range   Cholesterol, Total 140 100 - 199 mg/dL   Triglycerides 867 0 - 149 mg/dL   HDL 41 >60 mg/dL   VLDL Cholesterol Cal 23 5 - 40 mg/dL   LDL Chol Calc (NIH) 76 0 - 99 mg/dL   Chol/HDL Ratio 3.4 0.0 - 5.0 ratio    Comment:                                   T. Chol/HDL Ratio                                             Men  Women                               1/2 Avg.Risk  3.4    3.3                                   Avg.Risk  5.0    4.4                                2X Avg.Risk  9.6    7.1                                3X Avg.Risk 23.4   11.0   CMP14+EGFR     Status: Abnormal   Collection Time: 05/15/24  8:58 AM  Result Value Ref Range   Glucose 140 (H) 70 - 99 mg/dL   BUN 9 8 - 27 mg/dL   Creatinine, Ser 8.78 0.76 - 1.27 mg/dL   eGFR 66 >40 fO/fpw/8.26   BUN/Creatinine Ratio 7 (L) 10 - 24   Sodium 139 134 -  144 mmol/L   Potassium 5.0 3.5 - 5.2 mmol/L   Chloride 103 96 - 106 mmol/L   CO2 22 20 - 29 mmol/L   Calcium 9.6 8.6 - 10.2 mg/dL   Total Protein 7.1 6.0 - 8.5 g/dL   Albumin 4.2 3.9 - 4.9 g/dL   Globulin, Total 2.9 1.5 - 4.5 g/dL   Bilirubin Total 0.9 0.0 - 1.2 mg/dL   Alkaline Phosphatase 79 44 - 121 IU/L   AST 16 0 - 40 IU/L   ALT 16 0 - 44 IU/L  CBC with Differential/Platelet     Status: None   Collection Time: 05/15/24  8:58 AM  Result Value Ref Range   WBC 5.2 3.4 - 10.8 x10E3/uL   RBC  5.11 4.14 - 5.80 x10E6/uL   Hemoglobin 15.2 13.0 - 17.7 g/dL   Hematocrit 53.6 62.4 - 51.0 %   MCV 91 79 - 97 fL   MCH 29.7 26.6 - 33.0 pg   MCHC 32.8 31.5 - 35.7 g/dL   RDW 86.7 88.3 - 84.5 %   Platelets 166 150 - 450 x10E3/uL   Neutrophils 64 Not Estab. %   Lymphs 23 Not Estab. %   Monocytes 9 Not Estab. %   Eos 3 Not Estab. %   Basos 1 Not Estab. %   Neutrophils Absolute 3.4 1.4 - 7.0 x10E3/uL   Lymphocytes Absolute 1.2 0.7 - 3.1 x10E3/uL   Monocytes Absolute 0.5 0.1 - 0.9 x10E3/uL   EOS (ABSOLUTE) 0.1 0.0 - 0.4 x10E3/uL   Basophils Absolute 0.0 0.0 - 0.2 x10E3/uL   Immature Granulocytes 0 Not Estab. %   Immature Grans (Abs) 0.0 0.0 - 0.1 x10E3/uL   Assessment/ Plan: Wayne Munoz here for annual physical exam.   Annual physical exam  Mixed hyperlipidemia  Pre-diabetes - Plan: Bayer DCA Hb A1c Waived, Basic metabolic panel with GFR  Morbid obesity (HCC)  Lower urinary tract symptoms (LUTS)  We reviewed his labs.  His cholesterol panel is stable.  I do think he would be a good candidate for coronary artery calcium scoring and I gave him information on that today.  With diagnosis of hyperlipidemia and what appears to be possible new onset type 2 diabetes with fasting glucose at 140 but A1c was 6.3.  I reiterated need for sugar reduction and increase physical exercise and we talked about substitutions and a handout was provided today.  I offered referral to nutrition but he declined that today  LUTS are stable with Flomax .  Continue to follow-up with urology as scheduled  Counseled on healthy lifestyle choices, including diet (rich in fruits, vegetables and lean meats and low in salt and simple carbohydrates) and exercise (at least 30 minutes of moderate physical activity daily).  Patient to follow up 3 months for blood sugar recheck.  Future orders placed  Giah Fickett M. Jolinda, DO

## 2024-05-22 NOTE — Patient Instructions (Addendum)
 Consider a coronary artery calcium scan to look for calcium in your heart arteries. We talked about your blood sugar today, which fasting glucose is technically in diabetes range even though your A1c is in prediabetes. I'd like you to work on the changes we talked about and see me back in 3 months for recheck.  Coronary Calcium Scan A coronary calcium scan is an imaging test used to look for deposits of plaque in the inner lining of the blood vessels of the heart (coronary arteries). Plaque is made up of calcium, protein, and fatty substances. These deposits of plaque can partly clog and narrow the coronary arteries without producing any symptoms or warning signs. This puts a person at risk for a heart attack. A coronary calcium scan is performed using a computed tomography (CT) scanner machine without using a dye (contrast). This test is recommended for people who are at moderate risk for heart disease. The test can find plaque deposits before symptoms develop. Tell a health care provider about: Any allergies you have. All medicines you are taking, including vitamins, herbs, eye drops, creams, and over-the-counter medicines. Any problems you or family members have had with anesthetic medicines. Any bleeding problems you have. Any surgeries you have had. Any medical conditions you have. Whether you are pregnant or may be pregnant. What are the risks? Generally, this is a safe procedure. However, problems may occur, including: Harm to a pregnant woman and her unborn baby. This test involves the use of radiation. Radiation exposure can be dangerous to a pregnant woman and her unborn baby. If you are pregnant or think you may be pregnant, you should not have this procedure done. A slight increase in the risk of cancer. This is because of the radiation involved in the test. The amount of radiation from one test is similar to the amount of radiation you are naturally exposed to over one year. What  happens before the procedure? Ask your health care provider for any specific instructions on how to prepare for this procedure. You may be asked to avoid products that contain caffeine, tobacco, or nicotine for 4 hours before the procedure. What happens during the procedure?  You will undress and remove any jewelry from your neck or chest. You may need to remove hearing aides and dentures. Women may need to remove their bras. You will put on a hospital gown. Sticky electrodes will be placed on your chest. The electrodes will be connected to an electrocardiogram (ECG) machine to record a tracing of the electrical activity of your heart. You will lie down on your back on a curved bed that is attached to the CT scanner. You may be given medicine to slow down your heart rate so that clear pictures can be created. You will be moved into the CT scanner, and the CT scanner will take pictures of your heart. During this time, you will be asked to lie still and hold your breath for 10-20 seconds at a time while each picture of your heart is being taken. The procedure may vary among health care providers and hospitals. What can I expect after the procedure? You can return to your normal activities. It is up to you to get the results of your procedure. Ask your health care provider, or the department that is doing the procedure, when your results will be ready. Summary A coronary calcium scan is an imaging test used to look for deposits of plaque in the inner lining of the blood vessels  of the heart. Plaque is made up of calcium, protein, and fatty substances. A coronary calcium scan is performed using a CT scanner machine without contrast. Generally, this is a safe procedure. Tell your health care provider if you are pregnant or may be pregnant. Ask your health care provider for any specific instructions on how to prepare for this procedure. You can return to your normal activities after the scan is  done. This information is not intended to replace advice given to you by your health care provider. Make sure you discuss any questions you have with your health care provider. Document Revised: 09/28/2021 Document Reviewed: 09/28/2021 Elsevier Patient Education  2024 [employer].

## 2024-07-04 ENCOUNTER — Encounter: Admitting: Family Medicine

## 2024-08-02 ENCOUNTER — Ambulatory Visit (INDEPENDENT_AMBULATORY_CARE_PROVIDER_SITE_OTHER)

## 2024-08-02 ENCOUNTER — Ambulatory Visit: Payer: Self-pay | Admitting: Family Medicine

## 2024-08-02 ENCOUNTER — Ambulatory Visit (INDEPENDENT_AMBULATORY_CARE_PROVIDER_SITE_OTHER): Admitting: Family Medicine

## 2024-08-02 ENCOUNTER — Ambulatory Visit: Payer: Self-pay

## 2024-08-02 ENCOUNTER — Encounter: Payer: Self-pay | Admitting: Family Medicine

## 2024-08-02 VITALS — BP 128/76 | HR 57 | Temp 97.8°F | Ht 71.0 in | Wt 245.4 lb

## 2024-08-02 DIAGNOSIS — M545 Low back pain, unspecified: Secondary | ICD-10-CM

## 2024-08-02 DIAGNOSIS — R0789 Other chest pain: Secondary | ICD-10-CM

## 2024-08-02 DIAGNOSIS — W19XXXA Unspecified fall, initial encounter: Secondary | ICD-10-CM

## 2024-08-02 DIAGNOSIS — M5136 Other intervertebral disc degeneration, lumbar region with discogenic back pain only: Secondary | ICD-10-CM | POA: Diagnosis not present

## 2024-08-02 DIAGNOSIS — R0781 Pleurodynia: Secondary | ICD-10-CM | POA: Diagnosis not present

## 2024-08-02 DIAGNOSIS — M5137 Other intervertebral disc degeneration, lumbosacral region with discogenic back pain only: Secondary | ICD-10-CM | POA: Diagnosis not present

## 2024-08-02 MED ORDER — METHYLPREDNISOLONE ACETATE 80 MG/ML IJ SUSP
40.0000 mg | Freq: Once | INTRAMUSCULAR | Status: AC
Start: 2024-08-02 — End: 2024-08-02
  Administered 2024-08-02: 40 mg via INTRAMUSCULAR

## 2024-08-02 MED ORDER — METHYLPREDNISOLONE ACETATE 40 MG/ML IJ SUSP
40.0000 mg | Freq: Once | INTRAMUSCULAR | Status: DC
Start: 2024-08-02 — End: 2024-08-02

## 2024-08-02 NOTE — Telephone Encounter (Signed)
 FYI Only or Action Required?: FYI only for provider.  Patient was last seen in primary care on 05/22/2024 by Jolinda Norene HERO, DO.  Called Nurse Triage reporting Back Pain and Fall.  Symptoms began several days ago.  Interventions attempted: Ice/heat application.  Symptoms are: gradually worsening.  Triage Disposition: See PCP When Office is Open (Within 3 Days)  Patient/caregiver understands and will follow disposition?: Yes  Copied from CRM #[id number]. Topic: Clinical - Red Word Triage >> Aug 02, 2024  8:17 AM Mia F wrote: Red Word that prompted transfer to Nurse Triage: Had a fall on Sunday and hurt back. Pain seemed to been going away but came back last night and made it difficult for pt to lay down. Reason for Disposition  MILD weakness (e.g., does not interfere with ability to work, go to school, normal activities)  (Exception: Mild weakness is a chronic symptom.)  Answer Assessment - Initial Assessment Questions 1. MECHANISM: How did the fall happen?     Foot slipped out from under him and he landed on the door frame and then his bottom and lower back  2. DOMESTIC VIOLENCE AND ELDER ABUSE SCREENING: Did you fall because someone pushed you or tried to hurt you? If Yes, ask: Are you safe now?     No  3. ONSET: When did the fall happen? (e.g., minutes, hours, or days ago)     Sunday  4. LOCATION: What part of the body hit the ground? (e.g., back, buttocks, head, hips, knees, hands, head, stomach)     Back  5. INJURY: Did you hurt (injure) yourself when you fell? If Yes, ask: What did you injure? Tell me more about this? (e.g., body area; type of injury; pain severity)     Back, Aching, Sore  6. PAIN: Is there any pain? If Yes, ask: How bad is the pain? (e.g., Scale 0-10; or none, mild,      6  7. SIZE: For cuts, bruises, or swelling, ask: How large is it? (e.g., inches or centimeters)      Denies.  9. OTHER SYMPTOMS: Do you have any other  symptoms? (e.g., dizziness, fever, weakness; new-onset or worsening).      Denies any other symptoms  10. CAUSE: What do you think caused the fall (or falling)? (e.g., dizzy spell, tripped)       Slipped and fell  Protocols used: Falls and [facility]

## 2024-08-02 NOTE — Progress Notes (Signed)
 Subjective: CC: Fall PCP: Wayne Norene HERO, DO YEP:Wayne Munoz is a 68 y.o. male presenting to clinic today for:  Fall Patient is accompanied to the office by his wife.  He sustained a mechanical fall where he slipped and his foot flops on some water  when it was raining on Sunday.  He fell onto his right side into the door frame and hit the right ribs and right hip/buttock area.  He notes that he has been having pain with movement since that time but seems to be doing okay if he sits.  He notes that pain is worse at nighttime because he typically lays on this side to go to sleep.  He denies any bruising or soft tissue masses in this area.  Pain is worse with ambulation.  Utilizing lidocaine  4% pain patch, ice, heat and naproxen  500 mg twice daily.  He reports no hemoptysis or shortness of breath.  ROS: Per HPI  No Known Allergies Past Medical History:  Diagnosis Date   Psoriasis     Current Outpatient Medications:    Naphazoline HCl (CLEAR EYES OP), Place 1 drop into both eyes 3 (three) times a week., Disp: , Rfl:    naproxen  (EC NAPROSYN ) 500 MG EC tablet, Take 500 mg by mouth 2 (two) times daily with a meal., Disp: , Rfl:    tamsulosin  (FLOMAX ) 0.4 MG CAPS capsule, TAKE 1 CAPSULE BY MOUTH EVERY DAY, Disp: 90 capsule, Rfl: 3   TREMFYA 100 MG/ML SOPN, Inject 100 mg into the skin every 8 (eight) weeks., Disp: , Rfl:   Current Facility-Administered Medications:    methylPREDNISolone  acetate (DEPO-MEDROL ) injection 40 mg, 40 mg, Intramuscular, Once,  Social History   Socioeconomic History   Marital status: Married    Spouse name: Not on file   Number of children: Not on file   Years of education: Not on file   Highest education level: Not on file  Occupational History   Occupation: Retired  Tobacco Use   Smoking status: Never   Smokeless tobacco: Never  Vaping Use   Vaping status: Never Used  Substance and Sexual Activity   Alcohol use: No    Alcohol/week: 0.0  standard drinks of alcohol   Drug use: No   Sexual activity: Not on file  Other Topics Concern   Not on file  Social History Narrative   Wife, Wayne Munoz   He is retired from Retail buyer   Social Drivers of Corporate investment banker Strain: Not on [employer] Insecurity: Not on file  Transportation Needs: Not on file  Physical Activity: Not on file  Stress: Not on file  Social Connections: Not on file  Intimate Partner Violence: Not on file   History reviewed. No pertinent family history.  Objective: Office vital signs reviewed. BP 128/76   Pulse (!) 57   Temp 97.8 F (36.6 C)   Ht 5' 11 (1.803 m)   Wt 245 lb 6 oz (111.3 kg)   SpO2 95%   BMI 34.22 kg/m   Physical Examination:  General: Awake, alert, well nourished, No acute distress MSK: Ambulating independently with minimally antalgic gait.  He has hyperemia with mild excoriation noted along the right posterior lateral aspect of the rib at approximately level 9 through 11.  No tenderness palpation to the ischial crest but does have tenderness to palpation over the SI and lower lumbar areas on the right.  DG Lumbar Spine 2-3 Views Result Date: 08/02/2024 CLINICAL DATA:  Low back  pain after fall 2 days ago. EXAM: LUMBAR SPINE - 2-3 VIEW COMPARISON:  None Available. FINDINGS: There is no evidence of lumbar spine fracture. Alignment is normal. Mild degenerative disc disease is noted at L1-2. Moderate degenerative disc disease is noted at L5-S1. Probable gallstone noted in right upper quadrant. IMPRESSION: Multilevel degenerative disc disease. No acute abnormality seen. Electronically Signed   By: Lynwood Landy Raddle M.D.   On: 08/02/2024 11:55   DG Ribs Unilateral Right Result Date: 08/02/2024 CLINICAL DATA:  Right rib pain after fall 2 days ago. EXAM: RIGHT RIBS - 2 VIEW COMPARISON:  November 09, 2014. FINDINGS: No fracture or other bone lesions are seen involving the ribs. IMPRESSION: Negative. Electronically Signed   By: Lynwood Landy Raddle  M.D.   On: 08/02/2024 11:53     Assessment/ Plan: 68 y.o. male   Rib pain on right side - Plan: methylPREDNISolone  acetate (DEPO-MEDROL ) injection 40 mg, DG Ribs Unilateral Right  Acute right-sided low back pain without sciatica - Plan: methylPREDNISolone  acetate (DEPO-MEDROL ) injection 40 mg, DG Lumbar Spine 2-3 Views  Fall, initial encounter - Plan: DG Ribs Unilateral Right, DG Lumbar Spine 2-3 Views   Depo-Medrol  administered for pain relief.  Okay to continue Naprosyn , topicals and patch.  Plain films obtained to rule out any fractures.  Will contact patient with next steps pending results  **addendum Personally viewed demonstrated no fractures and this was confirmed by radiology who also noted some degenerative changes in the lumbar spine  Wayne Cantrall CHRISTELLA Fielding, DO [facility] [phone]

## 2024-08-18 ENCOUNTER — Other Ambulatory Visit

## 2024-08-18 DIAGNOSIS — R7303 Prediabetes: Secondary | ICD-10-CM | POA: Diagnosis not present

## 2024-08-18 LAB — BASIC METABOLIC PANEL WITH GFR
BUN/Creatinine Ratio: 10 (ref 10–24)
BUN: 11 mg/dL (ref 8–27)
CO2: 23 mmol/L (ref 20–29)
Calcium: 9.5 mg/dL (ref 8.6–10.2)
Chloride: 103 mmol/L (ref 96–106)
Creatinine, Ser: 1.12 mg/dL (ref 0.76–1.27)
Glucose: 129 mg/dL — ABNORMAL HIGH (ref 70–99)
Potassium: 4.5 mmol/L (ref 3.5–5.2)
Sodium: 141 mmol/L (ref 134–144)
eGFR: 72 mL/min/1.73 (ref >59)

## 2024-08-18 LAB — BAYER DCA HB A1C WAIVED: HB A1C (BAYER DCA - WAIVED): 5.5 % (ref 4.8–5.6)

## 2024-08-21 ENCOUNTER — Ambulatory Visit: Payer: Self-pay | Admitting: Family Medicine

## 2024-08-22 ENCOUNTER — Ambulatory Visit: Admitting: Family Medicine

## 2024-08-22 ENCOUNTER — Encounter: Payer: Self-pay | Admitting: Family Medicine

## 2024-08-22 VITALS — BP 150/75 | HR 59 | Temp 97.8°F | Ht 72.0 in | Wt 242.0 lb

## 2024-08-22 DIAGNOSIS — R0789 Other chest pain: Secondary | ICD-10-CM

## 2024-08-22 DIAGNOSIS — R739 Hyperglycemia, unspecified: Secondary | ICD-10-CM

## 2024-08-22 DIAGNOSIS — R030 Elevated blood-pressure reading, without diagnosis of hypertension: Secondary | ICD-10-CM | POA: Diagnosis not present

## 2024-08-22 DIAGNOSIS — E66811 Obesity, class 1: Secondary | ICD-10-CM

## 2024-08-22 NOTE — Progress Notes (Signed)
 Subjective: CC: Follow-up prediabetes PCP: Jolinda Norene HERO, DO YEP:Wayne Munoz is a 68 y.o. male presenting to clinic today for:  Prediabetes associated with morbid obesity 3 months ago he was noted to have A1c rising to 6.3.  Since that time he is really gotten strict about carb restriction, increase physical exercise has lost almost 30 pounds.  He really does not want to put his health at risk and so he is trying everything he can to get his blood sugar down and his cardiovascular health and check.  He reports that he feels better and has better energy and focus.  He has been walking about a mile to a mile and 1/2/day over the last couple of weeks with his wife.  He is working on getting more educated about sugar content and fruits and actually has been trying to work on higher fiber fruits since he had his blood work back.  He still wants to get his fasting blood sugar down and is working towards that.  A1c was 5.5  ROS: Per HPI  No Known Allergies Past Medical History:  Diagnosis Date   Psoriasis     Current Outpatient Medications:    Naphazoline HCl (CLEAR EYES OP), Place 1 drop into both eyes 3 (three) times a week., Disp: , Rfl:    naproxen  (EC NAPROSYN ) 500 MG EC tablet, Take 500 mg by mouth 2 (two) times daily with a meal., Disp: , Rfl:    tamsulosin  (FLOMAX ) 0.4 MG CAPS capsule, TAKE 1 CAPSULE BY MOUTH EVERY DAY, Disp: 90 capsule, Rfl: 3   TREMFYA 100 MG/ML SOPN, Inject 100 mg into the skin every 8 (eight) weeks., Disp: , Rfl:  Social History   Socioeconomic History   Marital status: Married    Spouse name: Not on file   Number of children: Not on file   Years of education: Not on file   Highest education level: Not on file  Occupational History   Occupation: Retired  Tobacco Use   Smoking status: Never   Smokeless tobacco: Never  Vaping Use   Vaping status: Never Used  Substance and Sexual Activity   Alcohol use: No    Alcohol/week: 0.0 standard drinks  of alcohol   Drug use: No   Sexual activity: Not on file  Other Topics Concern   Not on file  Social History Narrative   Wife, Olam   He is retired from Retail [employer]   Social Drivers of Corporate investment banker Strain: Not on [employer] Insecurity: Not on file  Transportation Needs: Not on file  Physical Activity: Not on file  Stress: Not on file  Social Connections: Not on file  Intimate Partner Violence: Not on file   No family history on file.  Objective: Office vital signs reviewed. BP (!) 150/75   Pulse (!) 59   Temp 97.8 F (36.6 C)   Ht 6' (1.829 m)   Wt 242 lb (109.8 kg)   SpO2 96%   BMI 32.82 kg/m   Physical Examination:  General: Awake, alert, well nourished, obese.  No acute distress HEENT: Sclera white.  Moist mucous membranes Cardio: regular rate and rhythm, S1S2 heard, no murmurs appreciated Pulm: clear to auscultation bilaterally, no wheezes, rhonchi or rales; normal work of breathing on room air   Recent Results (from the past 2160 hours)  Bayer DCA Hb A1c Waived     Status: None   Collection Time: 08/18/24  8:51 AM  Result Value Ref  Range   HB A1C (BAYER DCA - WAIVED) 5.5 4.8 - 5.6 %    Comment:          Prediabetes: 5.7 - 6.4          Diabetes: >6.4          Glycemic control for adults with diabetes: <7.0   Basic metabolic panel with GFR     Status: Abnormal   Collection Time: 08/18/24  8:53 AM  Result Value Ref Range   Glucose 129 (H) 70 - 99 mg/dL   BUN 11 8 - 27 mg/dL   Creatinine, Ser 8.87 0.76 - 1.27 mg/dL   eGFR 72 >40 fO/fpw/8.26   BUN/Creatinine Ratio 10 10 - 24   Sodium 141 134 - 144 mmol/L   Potassium 4.5 3.5 - 5.2 mmol/L   Chloride 103 96 - 106 mmol/L   CO2 23 20 - 29 mmol/L   Calcium 9.5 8.6 - 10.2 mg/dL    Assessment/ Plan: 68 y.o. male   Obesity, Class I, BMI 30-34.9  Elevated serum glucose - Plan: Bayer DCA Hb A1c Waived, Basic Metabolic Panel  Rib pain on right side  Elevated blood pressure reading   Fasting  blood sugar still elevated we discussed that technically 125 is considered type 2 diabetes.  He wants to continue modifying diet to see if perhaps he can avoid this diagnosis.  He plans on increasing fiber and protein and reducing sugars through fruits.  We had a discussion about how to incorporate more plant-based fiber and lean protein to help with weight loss goals.  Rib pain has resolved.  Very borderline blood pressure.  Will again allow him for diet modification as above and if persistently borderline at next visit, we will plan to start amlodipine  He will follow-up again in 4 to 6 months   Anella Nakata CHRISTELLA Fielding, DO [facility] [phone]

## 2024-12-26 ENCOUNTER — Ambulatory Visit: Payer: Self-pay | Admitting: Family Medicine

## 2025-04-03 ENCOUNTER — Encounter: Admitting: Family Medicine

## 2025-06-11 ENCOUNTER — Encounter: Admitting: Family Medicine
